# Patient Record
Sex: Female | Born: 1960 | Race: White | Hispanic: No | Marital: Married | Smoking: Never smoker
Health system: Southern US, Community
[De-identification: ages and names within clinical notes are randomized; demographics above are authoritative.]

## PROBLEM LIST (undated history)

## (undated) DIAGNOSIS — J45909 Unspecified asthma, uncomplicated: Secondary | ICD-10-CM

## (undated) DIAGNOSIS — B009 Herpesviral infection, unspecified: Secondary | ICD-10-CM

## (undated) DIAGNOSIS — I951 Orthostatic hypotension: Secondary | ICD-10-CM

## (undated) DIAGNOSIS — K635 Polyp of colon: Secondary | ICD-10-CM

## (undated) DIAGNOSIS — F39 Unspecified mood [affective] disorder: Secondary | ICD-10-CM

## (undated) DIAGNOSIS — M8580 Other specified disorders of bone density and structure, unspecified site: Secondary | ICD-10-CM

## (undated) DIAGNOSIS — H35033 Hypertensive retinopathy, bilateral: Secondary | ICD-10-CM

## (undated) DIAGNOSIS — G43909 Migraine, unspecified, not intractable, without status migrainosus: Secondary | ICD-10-CM

## (undated) DIAGNOSIS — J301 Allergic rhinitis due to pollen: Secondary | ICD-10-CM

## (undated) DIAGNOSIS — B001 Herpesviral vesicular dermatitis: Secondary | ICD-10-CM

## (undated) HISTORY — DX: Polyp of colon: K63.5

## (undated) HISTORY — DX: Herpesviral infection, unspecified: B00.9

## (undated) HISTORY — DX: Hypertensive retinopathy, bilateral: H35.033

## (undated) HISTORY — DX: Orthostatic hypotension: I95.1

## (undated) HISTORY — DX: Migraine, unspecified, not intractable, without status migrainosus: G43.909

## (undated) HISTORY — DX: Herpesviral vesicular dermatitis: B00.1

## (undated) HISTORY — DX: Unspecified mood (affective) disorder: F39

## (undated) HISTORY — DX: Other specified disorders of bone density and structure, unspecified site: M85.80

## (undated) HISTORY — DX: Allergic rhinitis due to pollen: J30.1

---

## 1998-08-17 ENCOUNTER — Other Ambulatory Visit: Admission: RE | Admit: 1998-08-17 | Discharge: 1998-08-17 | Payer: Self-pay | Admitting: Gynecology

## 1999-02-04 ENCOUNTER — Other Ambulatory Visit: Admission: RE | Admit: 1999-02-04 | Discharge: 1999-02-04 | Payer: Self-pay | Admitting: Gynecology

## 1999-09-11 ENCOUNTER — Other Ambulatory Visit: Admission: RE | Admit: 1999-09-11 | Discharge: 1999-09-11 | Payer: Self-pay | Admitting: Gynecology

## 1999-10-07 ENCOUNTER — Encounter: Payer: Self-pay | Admitting: Gynecology

## 1999-10-07 ENCOUNTER — Encounter: Admission: RE | Admit: 1999-10-07 | Discharge: 1999-10-07 | Payer: Self-pay | Admitting: Gynecology

## 1999-10-09 ENCOUNTER — Encounter: Payer: Self-pay | Admitting: Gynecology

## 1999-10-09 ENCOUNTER — Encounter: Admission: RE | Admit: 1999-10-09 | Discharge: 1999-10-09 | Payer: Self-pay | Admitting: Gynecology

## 2000-09-10 ENCOUNTER — Other Ambulatory Visit: Admission: RE | Admit: 2000-09-10 | Discharge: 2000-09-10 | Payer: Self-pay | Admitting: Gynecology

## 2000-11-03 ENCOUNTER — Encounter: Admission: RE | Admit: 2000-11-03 | Discharge: 2000-11-03 | Payer: Self-pay | Admitting: Gynecology

## 2000-11-03 ENCOUNTER — Encounter: Payer: Self-pay | Admitting: Gynecology

## 2001-09-22 ENCOUNTER — Encounter: Admission: RE | Admit: 2001-09-22 | Discharge: 2001-09-22 | Payer: Self-pay | Admitting: Gynecology

## 2001-09-22 ENCOUNTER — Other Ambulatory Visit: Admission: RE | Admit: 2001-09-22 | Discharge: 2001-09-22 | Payer: Self-pay | Admitting: Gynecology

## 2001-09-22 ENCOUNTER — Encounter: Payer: Self-pay | Admitting: Gynecology

## 2002-10-06 ENCOUNTER — Other Ambulatory Visit: Admission: RE | Admit: 2002-10-06 | Discharge: 2002-10-06 | Payer: Self-pay | Admitting: Gynecology

## 2002-10-07 ENCOUNTER — Encounter: Admission: RE | Admit: 2002-10-07 | Discharge: 2002-10-07 | Payer: Self-pay | Admitting: Gynecology

## 2002-10-07 ENCOUNTER — Encounter: Payer: Self-pay | Admitting: Gynecology

## 2003-10-09 ENCOUNTER — Other Ambulatory Visit: Admission: RE | Admit: 2003-10-09 | Discharge: 2003-10-09 | Payer: Self-pay | Admitting: Gynecology

## 2003-12-08 ENCOUNTER — Encounter: Admission: RE | Admit: 2003-12-08 | Discharge: 2003-12-08 | Payer: Self-pay | Admitting: Gynecology

## 2004-09-26 ENCOUNTER — Other Ambulatory Visit: Admission: RE | Admit: 2004-09-26 | Discharge: 2004-09-26 | Payer: Self-pay | Admitting: Gynecology

## 2004-12-16 ENCOUNTER — Encounter: Admission: RE | Admit: 2004-12-16 | Discharge: 2004-12-16 | Payer: Self-pay | Admitting: Gynecology

## 2005-05-28 ENCOUNTER — Ambulatory Visit (HOSPITAL_COMMUNITY): Admission: RE | Admit: 2005-05-28 | Discharge: 2005-05-28 | Payer: Self-pay | Admitting: Allergy and Immunology

## 2005-08-07 ENCOUNTER — Encounter: Admission: RE | Admit: 2005-08-07 | Discharge: 2005-08-07 | Payer: Self-pay | Admitting: Allergy and Immunology

## 2005-09-30 ENCOUNTER — Other Ambulatory Visit: Admission: RE | Admit: 2005-09-30 | Discharge: 2005-09-30 | Payer: Self-pay | Admitting: Gynecology

## 2005-12-17 ENCOUNTER — Encounter: Admission: RE | Admit: 2005-12-17 | Discharge: 2005-12-17 | Payer: Self-pay | Admitting: Gynecology

## 2006-10-05 ENCOUNTER — Other Ambulatory Visit: Admission: RE | Admit: 2006-10-05 | Discharge: 2006-10-05 | Payer: Self-pay | Admitting: Gynecology

## 2006-12-21 ENCOUNTER — Encounter: Admission: RE | Admit: 2006-12-21 | Discharge: 2006-12-21 | Payer: Self-pay | Admitting: Gynecology

## 2006-12-31 ENCOUNTER — Encounter: Admission: RE | Admit: 2006-12-31 | Discharge: 2006-12-31 | Payer: Self-pay | Admitting: Gynecology

## 2007-12-24 ENCOUNTER — Encounter: Admission: RE | Admit: 2007-12-24 | Discharge: 2007-12-24 | Payer: Self-pay | Admitting: Gynecology

## 2008-12-29 ENCOUNTER — Encounter: Admission: RE | Admit: 2008-12-29 | Discharge: 2008-12-29 | Payer: Self-pay | Admitting: Gynecology

## 2010-01-01 ENCOUNTER — Encounter: Admission: RE | Admit: 2010-01-01 | Discharge: 2010-01-01 | Payer: Self-pay | Admitting: Gynecology

## 2010-12-26 ENCOUNTER — Other Ambulatory Visit: Payer: Self-pay | Admitting: Gynecology

## 2010-12-26 DIAGNOSIS — Z1239 Encounter for other screening for malignant neoplasm of breast: Secondary | ICD-10-CM

## 2011-01-06 ENCOUNTER — Ambulatory Visit
Admission: RE | Admit: 2011-01-06 | Discharge: 2011-01-06 | Disposition: A | Discharge status: Home / Self Care | Payer: PRIVATE HEALTH INSURANCE | Source: Ambulatory Visit | Attending: Gynecology | Admitting: Gynecology

## 2011-01-06 DIAGNOSIS — Z1239 Encounter for other screening for malignant neoplasm of breast: Secondary | ICD-10-CM

## 2011-01-08 ENCOUNTER — Other Ambulatory Visit: Payer: Self-pay | Admitting: Gynecology

## 2011-01-08 DIAGNOSIS — R928 Other abnormal and inconclusive findings on diagnostic imaging of breast: Secondary | ICD-10-CM

## 2011-01-14 ENCOUNTER — Ambulatory Visit
Admission: RE | Admit: 2011-01-14 | Discharge: 2011-01-14 | Disposition: A | Discharge status: Home / Self Care | Payer: PRIVATE HEALTH INSURANCE | Source: Ambulatory Visit | Attending: Gynecology | Admitting: Gynecology

## 2011-01-14 DIAGNOSIS — R928 Other abnormal and inconclusive findings on diagnostic imaging of breast: Secondary | ICD-10-CM

## 2011-12-29 ENCOUNTER — Other Ambulatory Visit: Payer: Self-pay | Admitting: Gynecology

## 2011-12-29 DIAGNOSIS — Z1231 Encounter for screening mammogram for malignant neoplasm of breast: Secondary | ICD-10-CM

## 2012-01-19 ENCOUNTER — Ambulatory Visit
Admission: RE | Admit: 2012-01-19 | Discharge: 2012-01-19 | Disposition: A | Discharge status: Home / Self Care | Payer: PRIVATE HEALTH INSURANCE | Source: Ambulatory Visit | Attending: Gynecology | Admitting: Gynecology

## 2012-01-19 DIAGNOSIS — Z1231 Encounter for screening mammogram for malignant neoplasm of breast: Secondary | ICD-10-CM

## 2013-01-25 ENCOUNTER — Other Ambulatory Visit: Payer: Self-pay | Admitting: Gynecology

## 2013-01-26 ENCOUNTER — Other Ambulatory Visit: Payer: Self-pay | Admitting: Obstetrics and Gynecology

## 2013-01-26 DIAGNOSIS — M949 Disorder of cartilage, unspecified: Secondary | ICD-10-CM

## 2013-01-26 DIAGNOSIS — M899 Disorder of bone, unspecified: Secondary | ICD-10-CM

## 2013-01-26 DIAGNOSIS — Z1231 Encounter for screening mammogram for malignant neoplasm of breast: Secondary | ICD-10-CM

## 2013-03-03 ENCOUNTER — Ambulatory Visit
Admission: RE | Admit: 2013-03-03 | Discharge: 2013-03-03 | Disposition: A | Discharge status: Home / Self Care | Payer: BC Managed Care – PPO | Source: Ambulatory Visit | Attending: Obstetrics and Gynecology | Admitting: Obstetrics and Gynecology

## 2013-03-03 DIAGNOSIS — M899 Disorder of bone, unspecified: Secondary | ICD-10-CM

## 2013-03-03 DIAGNOSIS — Z1231 Encounter for screening mammogram for malignant neoplasm of breast: Secondary | ICD-10-CM

## 2013-03-07 ENCOUNTER — Other Ambulatory Visit: Payer: Self-pay | Admitting: Obstetrics and Gynecology

## 2013-03-07 DIAGNOSIS — R928 Other abnormal and inconclusive findings on diagnostic imaging of breast: Secondary | ICD-10-CM

## 2013-03-16 ENCOUNTER — Ambulatory Visit
Admission: RE | Admit: 2013-03-16 | Discharge: 2013-03-16 | Disposition: A | Discharge status: Home / Self Care | Payer: BC Managed Care – PPO | Source: Ambulatory Visit | Attending: Obstetrics and Gynecology | Admitting: Obstetrics and Gynecology

## 2013-03-16 DIAGNOSIS — R928 Other abnormal and inconclusive findings on diagnostic imaging of breast: Secondary | ICD-10-CM

## 2013-08-18 ENCOUNTER — Encounter (HOSPITAL_COMMUNITY): Payer: Self-pay | Admitting: Emergency Medicine

## 2013-08-18 ENCOUNTER — Inpatient Hospital Stay (HOSPITAL_COMMUNITY)
Admission: EM | Admit: 2013-08-18 | Discharge: 2013-08-20 | DRG: 297 | Disposition: A | Discharge status: Home / Self Care | Payer: BC Managed Care – PPO | Attending: Internal Medicine | Admitting: Internal Medicine

## 2013-08-18 DIAGNOSIS — H5509 Other forms of nystagmus: Secondary | ICD-10-CM | POA: Diagnosis present

## 2013-08-18 DIAGNOSIS — H905 Unspecified sensorineural hearing loss: Secondary | ICD-10-CM | POA: Diagnosis present

## 2013-08-18 DIAGNOSIS — H8109 Meniere's disease, unspecified ear: Secondary | ICD-10-CM | POA: Diagnosis present

## 2013-08-18 DIAGNOSIS — I10 Essential (primary) hypertension: Secondary | ICD-10-CM | POA: Diagnosis present

## 2013-08-18 DIAGNOSIS — R42 Dizziness and giddiness: Secondary | ICD-10-CM | POA: Diagnosis present

## 2013-08-18 DIAGNOSIS — E871 Hypo-osmolality and hyponatremia: Principal | ICD-10-CM | POA: Diagnosis present

## 2013-08-18 DIAGNOSIS — Z79899 Other long term (current) drug therapy: Secondary | ICD-10-CM

## 2013-08-18 DIAGNOSIS — T502X5A Adverse effect of carbonic-anhydrase inhibitors, benzothiadiazides and other diuretics, initial encounter: Secondary | ICD-10-CM | POA: Diagnosis present

## 2013-08-18 DIAGNOSIS — E876 Hypokalemia: Secondary | ICD-10-CM | POA: Diagnosis present

## 2013-08-18 DIAGNOSIS — J45909 Unspecified asthma, uncomplicated: Secondary | ICD-10-CM | POA: Diagnosis present

## 2013-08-18 HISTORY — DX: Unspecified asthma, uncomplicated: J45.909

## 2013-08-18 LAB — CBC WITH DIFFERENTIAL/PLATELET
Basophils Absolute: 0 10*3/uL (ref 0.0–0.1)
Basophils Relative: 0 % (ref 0–1)
Eosinophils Relative: 0 % (ref 0–5)
Lymphs Abs: 0.9 10*3/uL (ref 0.7–4.0)

## 2013-08-18 LAB — BASIC METABOLIC PANEL
BUN: 14 mg/dL (ref 6–23)
CO2: 23 mEq/L (ref 19–32)
GFR calc non Af Amer: >90 mL/min (ref >90)
Sodium: 115 mEq/L — CL (ref 135–145)

## 2013-08-18 MED ORDER — DIAZEPAM 5 MG/ML IJ SOLN: 5.0000 mg | Freq: Three times a day (TID) | INTRAMUSCULAR | Status: DC | PRN

## 2013-08-18 MED ORDER — HYDRALAZINE HCL 20 MG/ML IJ SOLN: 10.0000 mg | Freq: Four times a day (QID) | INTRAMUSCULAR | Status: DC | PRN

## 2013-08-18 MED ORDER — ADULT MULTIVITAMIN W/MINERALS CH
1.0000 | ORAL_TABLET | Freq: Every day | ORAL | Status: DC
  Administered 2013-08-19 – 2013-08-20 (×2): 1 via ORAL
  Filled 2013-08-18 (×2): qty 1

## 2013-08-18 MED ORDER — POTASSIUM CHLORIDE CRYS ER 20 MEQ PO TBCR
40.0000 meq | EXTENDED_RELEASE_TABLET | Freq: Once | ORAL | Status: AC
  Administered 2013-08-18: 40 meq via ORAL
  Filled 2013-08-18: qty 2

## 2013-08-18 MED ORDER — VITAMIN D3 25 MCG (1000 UNIT) PO TABS
1000.0000 [IU] | ORAL_TABLET | Freq: Every day | ORAL | Status: DC
  Administered 2013-08-18 – 2013-08-20 (×3): 1000 [IU] via ORAL
  Filled 2013-08-18 (×3): qty 1

## 2013-08-18 MED ORDER — ONDANSETRON HCL 4 MG/2ML IJ SOLN
4.0000 mg | Freq: Once | INTRAMUSCULAR | Status: AC
  Administered 2013-08-18: 4 mg via INTRAVENOUS
  Filled 2013-08-18: qty 2

## 2013-08-18 MED ORDER — DIAZEPAM 5 MG/ML IJ SOLN
5.0000 mg | Freq: Once | INTRAMUSCULAR | Status: AC
  Administered 2013-08-18: 5 mg via INTRAVENOUS
  Filled 2013-08-18: qty 2

## 2013-08-18 MED ORDER — PROMETHAZINE HCL 25 MG/ML IJ SOLN
12.5000 mg | INTRAMUSCULAR | Status: DC | PRN
  Filled 2013-08-18: qty 1

## 2013-08-18 MED ORDER — LORAZEPAM 2 MG/ML IJ SOLN
1.0000 mg | Freq: Once | INTRAMUSCULAR | Status: AC
  Administered 2013-08-18: 1 mg via INTRAVENOUS
  Filled 2013-08-18: qty 1

## 2013-08-18 MED ORDER — SODIUM CHLORIDE 0.9 % IV SOLN
INTRAVENOUS | Status: DC
  Administered 2013-08-18: 18:00:00 via INTRAVENOUS

## 2013-08-18 MED ORDER — MECLIZINE HCL 25 MG PO TABS
25.0000 mg | ORAL_TABLET | Freq: Three times a day (TID) | ORAL | Status: DC | PRN
  Administered 2013-08-18 – 2013-08-20 (×2): 25 mg via ORAL
  Filled 2013-08-18 (×3): qty 1

## 2013-08-18 MED ORDER — ACETAMINOPHEN 325 MG PO TABS
650.0000 mg | ORAL_TABLET | Freq: Four times a day (QID) | ORAL | Status: DC | PRN
  Administered 2013-08-19: 650 mg via ORAL
  Filled 2013-08-18: qty 2

## 2013-08-18 MED ORDER — POTASSIUM CHLORIDE 10 MEQ/100ML IV SOLN
10.0000 meq | INTRAVENOUS | Status: AC
  Administered 2013-08-18 – 2013-08-19 (×5): 10 meq via INTRAVENOUS
  Filled 2013-08-18 (×6): qty 100

## 2013-08-18 MED ORDER — FLUTICASONE PROPIONATE HFA 44 MCG/ACT IN AERO
1.0000 | INHALATION_SPRAY | Freq: Two times a day (BID) | RESPIRATORY_TRACT | Status: DC
  Administered 2013-08-19 – 2013-08-20 (×2): 1 via RESPIRATORY_TRACT
  Filled 2013-08-18 (×2): qty 10.6

## 2013-08-18 MED ORDER — ACETAMINOPHEN 650 MG RE SUPP: 650.0000 mg | Freq: Four times a day (QID) | RECTAL | Status: DC | PRN

## 2013-08-18 MED ORDER — SODIUM CHLORIDE 0.9 % IV BOLUS (SEPSIS)
1000.0000 mL | Freq: Once | INTRAVENOUS | Status: AC
  Administered 2013-08-18: 1000 mL via INTRAVENOUS

## 2013-08-18 MED ORDER — SODIUM CHLORIDE 0.9 % IJ SOLN
3.0000 mL | Freq: Two times a day (BID) | INTRAMUSCULAR | Status: DC
  Administered 2013-08-20: 3 mL via INTRAVENOUS

## 2013-08-18 NOTE — ED Provider Notes (Signed)
Date: 08/18/2013  Rate: 76  Rhythm: normal sinus rhythm  QRS Axis: normal  Intervals: Prolonged QTC at 544 ms  ST/T Wave abnormalities: normal  Conduction Disutrbances: none  Narrative Interpretation: Prolonged QTC, mild T wave flattening in lateral leads, no ischemic changes, no old for comparison      Layla Maw Zay Yeargan, DO 08/18/13 1829

## 2013-08-18 NOTE — ED Notes (Signed)
1O10-- report given to charge nurse on [facility]

## 2013-08-18 NOTE — ED Provider Notes (Signed)
TIME SEEN: 3:57 PM  CHIEF COMPLAINT: Vertigo, vomiting  HPI: Patient is a 52 year old female with a history of asthma who presents emergency department with intermittent vertigo for the past 3 weeks. She was seen by Dr. Haroldine Laws with ENT was diagnosed with Mnire's disease when she was found to have mild sensorineural hearing loss. She was started on diuretic and potassium and states that her symptoms have gotten progressively worse instead of better. Today she was seen by her primary care physician, Dr. Valentina Lucks, at [facility] when her symptoms became much worse and she began vomiting. She states that her vertigo is worse with movement. She denies any obvious hearing loss or tinnitus. No ear pain or discharge. No recent fever, cough or other upper respiratory illness. She denies any numbness, tingling or focal weakness. No prior history of stroke. No recent head injury. She is not on anticoagulation.  ROS: See HPI Constitutional: no fever  Eyes: no drainage  ENT: no runny nose   Cardiovascular:  no chest pain  Resp: no SOB  GI: no vomiting GU: no dysuria Integumentary: no rash  Allergy: no hives  Musculoskeletal: no leg swelling  Neurological: no slurred speech ROS otherwise negative  PAST MEDICAL HISTORY/PAST SURGICAL HISTORY:  No past medical history on file.  MEDICATIONS:  Prior to Admission medications   Not on File    ALLERGIES:  No Known Allergies  SOCIAL HISTORY:  History  Substance Use Topics  . Smoking status: Not on file  . Smokeless tobacco: Not on file  . Alcohol Use: Not on file    FAMILY HISTORY: No family history on file.  EXAM: There were no vitals taken for this visit. CONSTITUTIONAL: Alert and oriented and responds appropriately to questions. Well-appearing; well-nourished HEAD: Normocephalic EYES: Conjunctivae clear, PERRL ENT: normal nose; no rhinorrhea; moist mucous membranes; pharynx without lesions noted, TMs are clear bilaterally NECK:  Supple, no meningismus, no LAD  CARD: RRR; S1 and S2 appreciated; no murmurs, no clicks, no rubs, no gallops RESP: Normal chest excursion without splinting or tachypnea; breath sounds clear and equal bilaterally; no wheezes, no rhonchi, no rales,  ABD/GI: Normal bowel sounds; non-distended; soft, non-tender, no rebound, no guarding BACK:  The back appears normal and is non-tender to palpation, there is no CVA tenderness EXT: Normal ROM in all joints; non-tender to palpation; no edema; normal capillary refill; no cyanosis    SKIN: Normal color for age and race; warm NEURO: Moves all extremities equally, sensation to light touch intact diffusely, cranial nerves II through XII intact PSYCH: The patient's mood and manner are appropriate. Grooming and personal hygiene are appropriate.  MEDICAL DECISION MAKING: Patient with peripheral vertigo who was diagnosed with Mnire's disease by ENT.  I am not concerned for central cause of vertigo at this time. Patient has no recent history of head injury and is on anticoagulation. She also has no risk factors for stroke.  Her CHADS2 score is 0.  Will check basic labs, give medication for symptom control and reassess.  ED PROGRESS: Patient has a sodium of 115 potassium of 2.3. Her other labs appear normal. I do not suspect this is hemodilution rather due to her diuretic use. She is receiving IV fluids currently. We'll also replace her potassium with IV medication. Her vertigo has improved after Valium. Her PCP is at [facility], Dr. Valentina Lucks. Will discuss with hospitalist for admission giving hyponatremia and hypokalemia.  Patient is neurologically intact. No seizures. No current complaints. She states she feels much  better.  CRITICAL CARE Performed by: Raelyn Number   Total critical care time: 30 minutes  Critical care time was exclusive of separately billable procedures and treating other patients.  Critical care was necessary to treat or prevent  imminent or life-threatening deterioration.  Critical care was time spent personally by me on the following activities: development of treatment plan with patient and/or surrogate as well as nursing, discussions with consultants, evaluation of patient's response to treatment, examination of patient, obtaining history from patient or surrogate, ordering and performing treatments and interventions, ordering and review of laboratory studies, ordering and review of radiographic studies, pulse oximetry and re-evaluation of patient's condition.   Layla Maw Ward, DO 08/18/13 [phone]

## 2013-08-18 NOTE — H&P (Addendum)
[facility] History and Physical  Renee Hansen AOZ:[id number] DOB: 10-19-1961 DOA: 08/18/2013  Referring physician: er PCP: Astrid Divine, MD  Specialists:   Chief Complaint: sent by PCP  HPI: Renee Hansen is a 52 y.o. female  Who is relatively healthy.  Comes in after being started on chlorithalidone for vertigo by ENT- diagnosed with Meniere's dsease.  Vertigo has actually been worsening.  She saw her PCP today and was found to have a low Na and low K.  Her vertigo was also worsened causing her to vomit.  PCP sent to ER.  No fever, no chills, no SOB, no CP. No h/x of CVA- no h/o arrythmia.  Na was 115 in ER, and K was 2.3- Mg was requested.  IV valium given with improvement in symptoms.    Review of Systems: all systems reviewed, negative unless stated above   Past Medical History  Diagnosis Date  . Asthma    History reviewed. No pertinent past surgical history. Social History:  reports that she has never smoked. She does not have any smokeless tobacco history on file. She reports that she does not drink alcohol or use illicit drugs.  No Known Allergies  History reviewed. No pertinent family history.   Prior to Admission medications   Medication Sig Start Date End Date Taking? Authorizing Provider  beclomethasone (QVAR) 40 MCG/ACT inhaler Inhale 1 puff into the lungs daily.   Yes Historical Provider, MD  chlorthalidone (HYGROTON) 25 MG tablet Take 25 mg by mouth every other day.   Yes Historical Provider, MD  cholecalciferol (VITAMIN D) 1000 UNITS tablet Take 1,000 Units by mouth daily.   Yes Historical Provider, MD  fish oil-omega-3 fatty acids 1000 MG capsule Take 1 g by mouth daily.   Yes Historical Provider, MD  meclizine (ANTIVERT) 25 MG tablet Take 25 mg by mouth 3 (three) times daily as needed.   Yes Historical Provider, MD  Multiple Vitamin (MULTIVITAMIN WITH MINERALS) TABS tablet Take 1 tablet by mouth daily.   Yes Historical Provider, MD   potassium chloride (K-DUR) 10 MEQ tablet Take 10 mEq by mouth every other day.   Yes Historical Provider, MD  vitamin C (ASCORBIC ACID) 500 MG tablet Take 500 mg by mouth daily.   Yes Historical Provider, MD  vitamin E 400 UNIT capsule Take 400 Units by mouth daily.   Yes Historical Provider, MD   Physical Exam: Filed Vitals:   08/18/13 1621  BP: 150/123  Pulse: 83  Temp: 97.8 F (36.6 C)  Resp: 16     General:  A+Ox3, NAD  Eyes: *wnl  ENT: wnl  Neck: supple  Cardiovascular: rrr  Respiratory: clear anterior  Abdomen: +BS, soft  Skin: no rashes or lesions  Musculoskeletal: moves all 4 ext- strength equal  Psychiatric: normal mood/affect  Neurologic: CN 2-12 intact  Labs on Admission:  Basic Metabolic Panel:  Recent Labs Lab 08/18/13 1714  NA 115*  K 2.3*  CL 72*  CO2 23  GLUCOSE 154*  BUN 14  CREATININE 0.65  CALCIUM 9.2   Liver Function Tests: No results found for this basename: AST, ALT, ALKPHOS, BILITOT, PROT, ALBUMIN,  in the last 168 hours No results found for this basename: LIPASE, AMYLASE,  in the last 168 hours No results found for this basename: AMMONIA,  in the last 168 hours CBC:  Recent Labs Lab 08/18/13 1714  WBC 9.0  NEUTROABS 7.1  HGB 13.4  HCT 35.2*  MCV 81.7  PLT 211  Cardiac Enzymes: No results found for this basename: CKTOTAL, CKMB, CKMBINDEX, TROPONINI,  in the last 168 hours  BNP (last 3 results) No results found for this basename: PROBNP,  in the last 8760 hours CBG: No results found for this basename: GLUCAP,  in the last 168 hours  Radiological Exams on Admission: No results found.    Assessment/Plan Active Problems:   Hyponatremia   Hypokalemia   Vertigo   1. Hyponatremia- gentle IVF, recheck Na later tonight and then again in AM- prob due to diuretic 2. Hypokalemia- replete, check Mg 3. Vertigo- relieved with valium IV in ER- has seen ENT as outpatient 4. Prolonged Qtc on EKG- recheck in AM- hold  prolonging agents, monitor on tele 5. HTn- PRN and monitor    Code Status: full Family Communication: patient Disposition Plan: admit  Time spent: 70 min  Neelam Tiggs [facility] Pager [phone]  If 7PM-7AM, please contact night-coverage [email or web address] Password [email or web address] 08/18/2013, 6:18 PM

## 2013-08-18 NOTE — ED Notes (Signed)
Has been being treated for Menier's ds by Dr. Esther Hardy for 2 weeks, has gotten worse, went to see primary care dr, and had an episode of severe dizziness/nausea. Transported by EMS from dr's office, any movement increased nausea and dizziness.

## 2013-08-19 ENCOUNTER — Encounter (HOSPITAL_COMMUNITY): Payer: Self-pay

## 2013-08-19 ENCOUNTER — Inpatient Hospital Stay (HOSPITAL_COMMUNITY): Payer: BC Managed Care – PPO

## 2013-08-19 LAB — BASIC METABOLIC PANEL
BUN: 13 mg/dL (ref 6–23)
BUN: 11 mg/dL (ref 6–23)
BUN: 14 mg/dL (ref 6–23)
CO2: 28 mEq/L (ref 19–32)
CO2: 29 mEq/L (ref 19–32)
CO2: 30 mEq/L (ref 19–32)
CO2: 32 mEq/L (ref 19–32)
Calcium: 8.8 mg/dL (ref 8.4–10.5)
Chloride: 85 mEq/L — ABNORMAL LOW (ref 96–112)
Chloride: 91 mEq/L — ABNORMAL LOW (ref 96–112)
Creatinine, Ser: 0.71 mg/dL (ref 0.50–1.10)
GFR calc Af Amer: >90 mL/min (ref >90)
GFR calc non Af Amer: >90 mL/min (ref >90)
GFR calc non Af Amer: >90 mL/min (ref >90)
Glucose, Bld: 122 mg/dL — ABNORMAL HIGH (ref 70–99)
Glucose, Bld: 92 mg/dL (ref 70–99)
Glucose, Bld: 107 mg/dL — ABNORMAL HIGH (ref 70–99)
Potassium: 3.3 mEq/L — ABNORMAL LOW (ref 3.5–5.1)
Potassium: 2.7 mEq/L — CL (ref 3.5–5.1)
Potassium: 3 mEq/L — ABNORMAL LOW (ref 3.5–5.1)
Sodium: 121 mEq/L — ABNORMAL LOW (ref 135–145)
Sodium: 131 mEq/L — ABNORMAL LOW (ref 135–145)

## 2013-08-19 LAB — URINALYSIS, ROUTINE W REFLEX MICROSCOPIC
Bilirubin Urine: NEGATIVE
Ketones, ur: 40 mg/dL — AB
Nitrite: NEGATIVE
Protein, ur: NEGATIVE mg/dL
Urobilinogen, UA: 1 mg/dL (ref 0.0–1.0)

## 2013-08-19 LAB — CBC
Hemoglobin: 13 g/dL (ref 12.0–15.0)
MCH: 30.7 pg (ref 26.0–34.0)
MCHC: 36.9 g/dL — ABNORMAL HIGH (ref 30.0–36.0)
MCV: 83.2 fL (ref 78.0–100.0)
RBC: 4.23 MIL/uL (ref 3.87–5.11)

## 2013-08-19 MED ORDER — IBUPROFEN 600 MG PO TABS
600.0000 mg | ORAL_TABLET | Freq: Four times a day (QID) | ORAL | Status: DC | PRN
  Filled 2013-08-19: qty 1

## 2013-08-19 MED ORDER — POTASSIUM CHLORIDE CRYS ER 20 MEQ PO TBCR
60.0000 meq | EXTENDED_RELEASE_TABLET | Freq: Four times a day (QID) | ORAL | Status: AC
  Administered 2013-08-19 (×2): 60 meq via ORAL
  Filled 2013-08-19 (×2): qty 3

## 2013-08-19 MED ORDER — POTASSIUM CHLORIDE 2 MEQ/ML IV SOLN
INTRAVENOUS | Status: DC
  Administered 2013-08-19 – 2013-08-20 (×2): via INTRAVENOUS
  Filled 2013-08-19 (×3): qty 1000

## 2013-08-19 MED ORDER — GADOBENATE DIMEGLUMINE 529 MG/ML IV SOLN
15.0000 mL | Freq: Once | INTRAVENOUS | Status: AC
  Administered 2013-08-19: 12 mL via INTRAVENOUS

## 2013-08-19 MED ORDER — MAGNESIUM SULFATE 40 MG/ML IJ SOLN
2.0000 g | Freq: Once | INTRAMUSCULAR | Status: AC
  Administered 2013-08-19: 2 g via INTRAVENOUS
  Filled 2013-08-19: qty 50

## 2013-08-19 NOTE — Care Management Note (Unsigned)
    Page 1 of 1   08/19/2013     3:00:59 PM   CARE MANAGEMENT NOTE 08/19/2013  Patient:  Renee Hansen, Renee Hansen   Account Number:  [id number]  Date Initiated:  08/19/2013  Documentation initiated by:  Letha Cape  Subjective/Objective Assessment:   dx hyponatremia  admit- lives with spouse, pta indep.     Action/Plan:   pt eval   Anticipated DC Date:  08/20/2013   Anticipated DC Plan:  HOME W HOME HEALTH SERVICES      DC Planning Services  CM consult      Choice offered to / List presented to:             Status of service:  In process, will continue to follow Medicare Important Message given?   (If response is "NO", the following Medicare IM given date fields will be blank) Date Medicare IM given:   Date Additional Medicare IM given:    Discharge Disposition:    Per UR Regulation:  Reviewed for med. necessity/level of care/duration of stay  If discussed at Long Length of Stay Meetings, dates discussed:    Comments:  08/19/13 14:59 Letha Cape RN BSN [phone] patient lives with spouse , pta indep.  await pt eval.  NCM will continue to follow for dc needs.

## 2013-08-19 NOTE — Progress Notes (Signed)
[facility] PROGRESS NOTE  Renee Hansen ZOX:[id number] DOB: 1961-09-03 DOA: 08/18/2013 PCP: Astrid Divine, MD  52 yo with three week history of vertigo.  She was started on chlorithalidone for possible meniere's disease. Admitted with severe electrolyte abnormalities.  Assessment/Plan:  Hypokalemia, hypochloridemia, hyponatremia Likely secondary to diuretic use So severe patient had neurological changes and vomiting Slowly resolving with IV fluids and supplementation Labs obtained from PCP office: BMET and CBC were normal on 07/27/13  Vertigo ?meniere's Check MRI brain PT eval PRN meclizine PRN valium  Prolonged Qtc on EKG Likely related to electrolyte abnormalities Will monitor and be cautious with medications Repeat EKG on 9/20 am  HTN Not currently on medications. Will monitor.   DVT Prophylaxis:    Code Status: full Family Communication: husband at bedside Disposition Plan: inpatient.   Consultants:  PT  Procedures:    Antibiotics:    HPI/Subjective: Patient feeling much better today. Hungry.  Complains of headache, but no longer vomiting or shaking.  Objective: Filed Vitals:   08/18/13 1745 08/19/13 0613 08/19/13 0944 08/19/13 1028  BP: 117/91 108/62    Pulse: 77 75    Temp:  98.7 F (37.1 C)  98.5 F (36.9 C)  TempSrc:  Oral  Oral  Resp:  16    Height:   5\' 11"  (1.803 m)   Weight:   61.236 kg (135 lb)   SpO2: 100% 96%      Intake/Output Summary (Last 24 hours) at 08/19/13 1219 Last data filed at 08/19/13 0600  Gross per 24 hour  Intake   1880 ml  Output      0 ml  Net   1880 ml   Filed Weights   08/19/13 0944  Weight: 61.236 kg (135 lb)    Exam:   General:  A&O, NAD, Lying comfortably in bed  Cardiovascular: RRR, no murmurs, rubs or gallops, no lower extremity edema  Respiratory: CTA, no wheeze, crackles, or rales.  No increased work of breathing.  Abdomen: Soft, non-tender, non-distended, + bowel  sounds, no masses  Musculoskeletal: Able to move all 4 extremities, 5/5 strength in each  Neuro:  + horizontal nystagmus (mild).  Otherwise non focal.  Data Reviewed: Basic Metabolic Panel:  Recent Labs Lab 08/18/13 1714 08/18/13 2232 08/19/13 0615  NA 115* 119* 121*  K 2.3* 2.1* 3.3*  CL 72* 77* 81*  CO2 23 28 29   GLUCOSE 154* 122* 92  BUN 14 13 11   CREATININE 0.65 0.71 0.74  CALCIUM 9.2 8.8 9.4  MG 1.5 1.7  --    CBC:  Recent Labs Lab 08/18/13 1714 08/19/13 0615  WBC 9.0 10.6*  NEUTROABS 7.1  --   HGB 13.4 13.0  HCT 35.2* 35.2*  MCV 81.7 83.2  PLT 211 220    Studies: No results found.  Scheduled Meds: . cholecalciferol  1,000 Units Oral Daily  . fluticasone  1 puff Inhalation BID  . multivitamin with minerals  1 tablet Oral Daily  . sodium chloride  3 mL Intravenous Q12H   Continuous Infusions: . sodium chloride 0.9 % 1,000 mL with potassium chloride 40 mEq infusion      Active Problems:   Hyponatremia   Hypokalemia   Vertigo    Conley Canal  [facility] Pager [phone]. If 7PM-7AM, please contact night-coverage at [email or web address], password [email or web address] 08/19/2013, 12:19 PM  LOS: 1 day

## 2013-08-19 NOTE — Progress Notes (Signed)
Brief Nutrition Note:  Discussed pt in team rounds, asked to speak with pt.  Pt states she was eating well prior to admission, except for 2-3 days where her intake was limited to crackers and ginger ale. Pt appetite is improving, ate 50% of breakfast this morning.   Encouraged pt to ask for snacks as needed from unit.   Body mass index is 18.84 kg/(m^2). WNL Diet: Regular  Chart reviewed, no nutrition interventions warranted at this time. Please consult as needed.   Renee Hansen RD, LDN Pager [phone] After Hours pager [phone]

## 2013-08-19 NOTE — Evaluation (Signed)
Physical Therapy Evaluation Patient Details Name: Renee Hansen MRN: [id number] DOB: [birth year] Today's Date: 08/19/2013 Time: 1531-1610 PT Time Calculation (min): 39 min  PT Assessment / Plan / Recommendation History of Present Illness  Patient is a 52 yo female admitted with hyponatremia, hypokalemia, worsening vertigo, and hypotension.   Clinical Impression  Patient presents with problems listed below.  Performed vestibular evaluation.  Normal oculomotor evaluation.  Dix-Hallpike and Roll testing were negative for BPPV. Patient with dizziness and oscillopsia noted with gait - bil. Hypofunction.  Provided exercises and will continue in am.  Patient also with hypotension, low sodium and potassium - could contribute to dizziness as well.  May need OP PT for vestibular rehab program - will determine at next visit.    PT Assessment  Patient needs continued PT services    Follow Up Recommendations  Outpatient PT;Supervision for mobility/OOB    Does the patient have the potential to tolerate intense rehabilitation      Barriers to Discharge        Equipment Recommendations  None recommended by PT    Recommendations for Other Services     Frequency Min 5X/week    Precautions / Restrictions Precautions Precautions: Fall Restrictions Weight Bearing Restrictions: No   Pertinent Vitals/Pain       Mobility  Bed Mobility Bed Mobility: Rolling Right;Rolling Left;Left Sidelying to Sit;Sitting - Scoot to [place] of Bed;Sit to Supine Rolling Right: 7: Independent Rolling Left: 7: Independent Left Sidelying to Sit: 7: Independent Sitting - Scoot to Edge of Bed: 7: Independent Sit to Supine: 7: Independent Details for Bed Mobility Assistance: No assist needed.  No dizziness or nystagmus noted with position changes. Transfers Transfers: Sit to Stand;Stand to Sit Sit to Stand: 5: Supervision;From bed Stand to Sit: 5: Supervision;To bed Details for Transfer Assistance: Supervision  for safety/balance.  No dizziness with standing.  Balance good with static standing. Ambulation/Gait Ambulation/Gait Assistance: 4: Min guard Ambulation Distance (Feet): 300 Feet Assistive device: None Ambulation/Gait Assistance Details: Patient noted slight dizziness with gait.  Patient with oscillopsia with gait.  Balance decreased with gait. Gait Pattern: Step-through pattern Gait velocity: WFL    Exercises Other Exercises Other Exercises: x1 exercise in standing Other Exercises: Saccades in sitting   PT Diagnosis: Abnormality of gait (Dizziness)  PT Problem List: Decreased balance (Dizziness) PT Treatment Interventions: Gait training;Functional mobility training;Therapeutic exercise;Balance training (Vestibular Rehab)     PT Goals(Current goals can be found in the care plan section) Acute Rehab PT Goals Patient Stated Goal: To stop dizziness PT Goal Formulation: With patient Time For Goal Achievement: 08/26/13 Potential to Achieve Goals: Good  Visit Information  Last PT Received On: 08/19/13 Assistance Needed: +1 History of Present Illness: Patient is a 52 yo female admitted with hyponatremia, hypokalemia, worsening vertigo, and hypotension.        Prior Functioning  Home Living Family/patient expects to be discharged to:: Private residence Living Arrangements: Spouse/significant other Available Help at Discharge: Family;Available 24 hours/day Type of Home: House Home Access: Stairs to enter [employer] of Steps: 4 Entrance Stairs-Rails: Right;Left Home Layout: One level Home Equipment: None Prior Function Level of Independence: Independent Comments: Works in Education officer, museum: No difficulties    Cognition  Cognition Arousal/Alertness: Awake/alert Behavior During Therapy: WFL for tasks assessed/performed Overall Cognitive Status: Within Functional Limits for tasks assessed    Extremity/Trunk Assessment Upper Extremity  Assessment Upper Extremity Assessment: Overall WFL for tasks assessed Lower Extremity Assessment Lower Extremity Assessment: Overall WFL for tasks  assessed Cervical / Trunk Assessment Cervical / Trunk Assessment: Normal   Balance High Level Balance High Level Balance Activites: Turns;Sudden stops;Head turns High Level Balance Comments: Noted decreased balance with high level activities.  End of Session PT - End of Session Equipment Utilized During Treatment: Gait belt Activity Tolerance: Patient tolerated treatment well Patient left: in bed;with call bell/phone within reach;with family/visitor present Nurse Communication: Mobility status  GP     Vena Austria 08/19/2013, 5:56 PM Durenda Hurt. Renaldo Fiddler, [facility] Acute Rehab Services Pager [phone]

## 2013-08-19 NOTE — Progress Notes (Signed)
CRITICAL VALUE ALERT  Critical value received:  K+2.1, Na 119  Date of notification:  08/19/13  Time of notification:  0022  Critical value read back:yes  Nurse who received alert:  Kennis Carina RN  MD notified (1st page):  Benedetto Coons NP  Time of first page:  0022  Responding MD:  Benedetto Coons NP  Time MD responded:  [id number]  Per NP, continue to administer potassium IVPB as scheduled. Please call NP for basic metabolic panel results in AM. Pt is resting with no distress a this time. Will continue to monitor. Gilman Schmidt

## 2013-08-19 NOTE — Progress Notes (Signed)
CRITICAL VALUE ALERT  Critical value received: K+ 2.7  Date of notification:  08/19/13  Time of notification:  17:30  Critical value read back:yes  Nurse who received alert: Larina Bras, RN  MD notified (1st page): Elmahi  Time of first page: 17:35  MD notified (2nd page): N/A  Time of second page:  Responding MD:  Arthor Captain  Time MD responded: 17:35

## 2013-08-19 NOTE — Progress Notes (Signed)
Addendum  Patient seen and examined, chart and data base reviewed.  I agree with the above assessment and plan.  For full details please see Mrs. Algis Downs PA note.  Present with hyponatremia and hypokalemia, was placed recently on diuretic for Mnire disease.   Complaining about vertigo, she does have horizontal nystagmus. PT/OT to evaluate.   Clint Lipps, MD [facility] Pager: [phone] 08/19/2013, 2:26 PM

## 2013-08-19 NOTE — Progress Notes (Signed)
  Pt orientation to unit, room and routine. Information packet given to patient and spouse.  Admission INP armband ID verified with patient/family, and in place. SR up x 2, fall risk assessment complete with Patient and family verbalizing understanding of risks associated with falls. Pt verbalizes an understanding of how to use the call bell and to call for help before getting out of bed.     Will cont to monitor and assist as needed.  Gilman Schmidt, RN

## 2013-08-20 LAB — CBC
HCT: 35.2 % — ABNORMAL LOW (ref 36.0–46.0)
Hemoglobin: 12.8 g/dL (ref 12.0–15.0)
MCHC: 36.4 g/dL — ABNORMAL HIGH (ref 30.0–36.0)
MCV: 85.6 fL (ref 78.0–100.0)

## 2013-08-20 LAB — BASIC METABOLIC PANEL
BUN: 12 mg/dL (ref 6–23)
Chloride: 99 mEq/L (ref 96–112)
GFR calc non Af Amer: >90 mL/min (ref >90)
Glucose, Bld: 91 mg/dL (ref 70–99)
Potassium: 4 mEq/L (ref 3.5–5.1)

## 2013-08-20 MED ORDER — MECLIZINE HCL 25 MG PO TABS: 25.0000 mg | ORAL_TABLET | Freq: Three times a day (TID) | ORAL | Status: DC | PRN

## 2013-08-20 NOTE — Discharge Summary (Signed)
Physician Discharge Summary  Renee Hansen ZOX:[id number] DOB: [birth year] DOA: 08/18/2013  PCP: Renee Divine, MD  Admit date: 08/18/2013 Discharge date: 08/20/2013  Time spent: 40 minutes  Recommendations for Outpatient Follow-up:  1. Followup with primary care physician within one week  Discharge Diagnoses:  Active Problems:   Hyponatremia   Hypokalemia   Vertigo   Discharge Condition: Stable  Diet recommendation: Regular diet  Filed Weights   08/19/13 0944  Weight: 61.236 kg (135 lb)    History of present illness:  Renee Hansen is a 52 y.o. female  Who is relatively healthy. Comes in after being started on chlorithalidone for vertigo by ENT- diagnosed with Meniere's dsease. Vertigo has actually been worsening. She saw her PCP today and was found to have a low Na and low K. Her vertigo was also worsened causing her to vomit. PCP sent to ER. No fever, no chills, no SOB, no CP. No h/x of CVA- no h/o arrythmia.  Na was 115 in ER, and K was 2.3- Mg was requested. IV valium given with improvement in symptoms.   Hospital Course:   1. Hyponatremia: Severe hyponatremia, likely secondary to chlorthalidone, patient was recently diagnosed with minerals disease and started on chlorthalidone. Patient presents to the hospital with sodium of 115, she acknowledged symptoms of dizziness and generalized weakness. Patient started on normal saline at 75 mL per hour, sodium went up nicely and slowly to one hundred thirty-five over two days. The correction rate was never exceeded 0.5 mEq per hour. BMP was checked every 8 hours while she is on normal saline.   2. Hyponatremia: Severe hyponatremia, likely secondary to chlorthalidone, although patient was taking potassium supplements or the diuretics but she did develop a hyponatremia, think this is also contributing to her generalized weakness. Patient presented with sodium of 2.1, this is repleted parenterally and orally, on the day of  discharge potassium is 4.0.   3. Vertigo: Patient has issues with imbalance, oscillopsia and vertigo. Patient evaluated by MRI of the brain showed no evidence of intracranial abnormalities, PT/OT evaluated the patient recommended outpatient PT for vestibular rehabilitation. Please note that the Dix-Hallpike maneuver in the Roll testing were negative for BPPV.  Procedures:  None  Consultations:  PT/OT recommended outpatient vestibular rehabilitation  Discharge Exam: Filed Vitals:   08/20/13 0646  BP: 117/71  Pulse: 78  Temp: 98.2 F (36.8 C)  Resp: 18  General: Alert and awake, oriented x3, not in any acute distress. HEENT: anicteric sclera, pupils reactive to light and accommodation, EOMI CVS: S1-S2 clear, no murmur rubs or gallops Chest: clear to auscultation bilaterally, no wheezing, rales or rhonchi Abdomen: soft nontender, nondistended, normal bowel sounds, no organomegaly Extremities: no cyanosis, clubbing or edema noted bilaterally Neuro: Cranial nerves II-XII intact, no focal neurological deficits  Discharge Instructions  Discharge Orders   Future Orders Complete By Expires   Ambulatory referral to Physical Therapy  As directed    Comments:     Vestibular rehab   Questions:     Iontophoresis - 4 mg/ml of dexamethasone:     T.E.N.S. Unit Evaluation and Dispense as Indicated:     Increase activity slowly  As directed        Medication List    STOP taking these medications       chlorthalidone 25 MG tablet  Commonly known as:  HYGROTON     potassium chloride 10 MEQ tablet  Commonly known as:  K-DUR      TAKE  these medications       beclomethasone 40 MCG/ACT inhaler  Commonly known as:  QVAR  Inhale 1 puff into the lungs daily.     cholecalciferol 1000 UNITS tablet  Commonly known as:  VITAMIN D  Take 1,000 Units by mouth daily.     fish oil-omega-3 fatty acids 1000 MG capsule  Take 1 g by mouth daily.     meclizine 25 MG tablet  Commonly known  as:  ANTIVERT  Take 1 tablet (25 mg total) by mouth 3 (three) times daily as needed.     multivitamin with minerals Tabs tablet  Take 1 tablet by mouth daily.     vitamin C 500 MG tablet  Commonly known as:  ASCORBIC ACID  Take 500 mg by mouth daily.     vitamin E 400 UNIT capsule  Take 400 Units by mouth daily.       No Known Allergies     Follow-up Information   Follow up with Renee Divine, MD In 1 week.   Specialty:  Family Medicine   Contact information:   [address], [address] [place] [address] [phone]        The results of significant diagnostics from this hospitalization (including imaging, microbiology, ancillary and laboratory) are listed below for reference.    Significant Diagnostic Studies: Mr Renee Hansen Contrast  24-Aug-2013   CLINICAL DATA:  52 year old female with acute pronounced vertigo. Discovered to have hyponatremia. Vomiting.  EXAM: MRI HEAD WITHOUT AND WITH CONTRAST  TECHNIQUE: Multiplanar, multiecho pulse sequences of the brain and surrounding structures were obtained according to standard protocol without and with intravenous contrast  CONTRAST:  12 mL MultiHance.  COMPARISON:  None.  FINDINGS: Cerebral volume is normal. No restricted diffusion to suggest acute infarction. No midline shift, mass effect, evidence of mass lesion, ventriculomegaly, extra-axial collection or acute intracranial hemorrhage. Cervicomedullary junction and pituitary are within normal limits. Major intracranial vascular flow voids are preserved. Negative visualized cervical spine.  Normal for age gray and white matter signal in the brain; 1 or 2 small foci of nonspecific subcortical white matter T2 and FLAIR hyperintensity (series 6, image 18). Normal appearance of the pituitary infundibulum, pituitary gland, and hypothalamus. No abnormal enhancement identified.  Normal bone marrow signal. Grossly normal visualized internal auditory structures. Mastoids are  clear.  Paranasal sinuses are clear. Visualized orbit soft tissues are within normal limits. Negative scalp soft tissues.  IMPRESSION: Normal MRI appearance of the brain. No acute intracranial abnormality identified.   Electronically Signed   By: Augusto Gamble M.D.   On: 08-24-2013 13:03    Microbiology: No results found for this or any previous visit (from the past 240 hour(s)).   Labs: Basic Metabolic Panel:  Recent Labs Lab 08/18/13 1714 08/18/13 2232 Aug 24, 2013 0615 08/24/2013 1640 24-Aug-2013 2100 08/20/13 0435  NA 115* 119* 121* 124* 131* 135  K 2.3* 2.1* 3.3* 2.7* 3.0* 4.0  CL 72* 77* 81* 85* 91* 99  CO2 23 28 29 30  32 29  GLUCOSE 154* 122* 92 97 107* 91  BUN 14 13 11 14 14 12   CREATININE 0.65 0.71 0.74 0.74 0.77 0.72  CALCIUM 9.2 8.8 9.4 9.1 8.9 8.9  MG 1.5 1.7  --   --   --   --    Liver Function Tests: No results found for this basename: AST, ALT, ALKPHOS, BILITOT, PROT, ALBUMIN,  in the last 168 hours No results found for this basename: LIPASE, AMYLASE,  in the last 168 hours No results found for this basename: AMMONIA,  in the last 168 hours CBC:  Recent Labs Lab 08/18/13 1714 08/19/13 0615 08/20/13 0435  WBC 9.0 10.6* 7.1  NEUTROABS 7.1  --   --   HGB 13.4 13.0 12.8  HCT 35.2* 35.2* 35.2*  MCV 81.7 83.2 85.6  PLT 211 220 215   Cardiac Enzymes: No results found for this basename: CKTOTAL, CKMB, CKMBINDEX, TROPONINI,  in the last 168 hours BNP: BNP (last 3 results) No results found for this basename: PROBNP,  in the last 8760 hours CBG: No results found for this basename: GLUCAP,  in the last 168 hours     Signed:  Jezabel Lecker A  [facility] 08/20/2013, 12:07 PM

## 2013-08-20 NOTE — Progress Notes (Signed)
   CARE MANAGEMENT NOTE 08/20/2013  Patient:  Renee Hansen, Renee Hansen   Account Number:  [id number]  Date Initiated:  08/19/2013  Documentation initiated by:  Letha Cape  Subjective/Objective Assessment:   dx hyponatremia  admit- lives with spouse, pta indep.     Action/Plan:   pt eval   Anticipated DC Date:  08/20/2013   Anticipated DC Plan:  HOME W HOME HEALTH SERVICES      DC Planning Services  CM consult      Choice offered to / List presented to:             Status of service:  Completed, signed off Medicare Important Message given?   (If response is "NO", the following Medicare IM given date fields will be blank) Date Medicare IM given:   Date Additional Medicare IM given:    Discharge Disposition:  HOME/SELF CARE  Per UR Regulation:  Reviewed for med. necessity/level of care/duration of stay  If discussed at Long Length of Stay Meetings, dates discussed:    Comments:  08/20/13 13:00 CM called to set up outpt vestibular rehab with [facility].  [facility].  Map given to pt and no other CM needs were communicated.  Freddy Jaksch, BSN, [place] [phone].   08/19/13 14:59 Letha Cape RN BSN [phone] patient lives with spouse , pta indep.  await pt eval.  NCM will continue to follow for dc needs.

## 2013-08-20 NOTE — Progress Notes (Signed)
Physical Therapy Treatment Patient Details Name: Renee Hansen MRN: [id number] DOB: 1961/03/12 Today's Date: 08/20/2013 Time: 4098-1191 PT Time Calculation (min): 24 min  PT Assessment / Plan / Recommendation  History of Present Illness Patient is a 52 yo female admitted with hyponatremia, hypokalemia, worsening vertigo, and hypotension.    PT Comments   Patient continued to have difficulty with smooth pursuits, eyes jumping from target.  Continues to report oscillopsia with gait.  Continues to report having "fullness" of head.  Patient provided with 3 exercises to complete at home - she and husband able to complete independently.  Patient to be discharged today.  Recommend OP PT for Vestibular Rehab program.  Follow Up Recommendations  Outpatient PT;Supervision for mobility/OOB     Does the patient have the potential to tolerate intense rehabilitation     Barriers to Discharge        Equipment Recommendations  None recommended by PT    Recommendations for Other Services    Frequency Min 5X/week   Progress towards PT Goals Progress towards PT goals: Goals met/education completed, patient discharged from PT  Plan Current plan remains appropriate    Precautions / Restrictions Precautions Precautions: Fall Restrictions Weight Bearing Restrictions: No   Pertinent Vitals/Pain     Mobility  Bed Mobility Bed Mobility: Supine to Sit;Sitting - Scoot to Edge of Bed;Sit to Supine Supine to Sit: 7: Independent Sitting - Scoot to Edge of Bed: 7: Independent Sit to Supine: 7: Independent Transfers Transfers: Sit to Stand;Stand to Sit Sit to Stand: 5: Supervision;From bed Stand to Sit: 5: Supervision;To bed Details for Transfer Assistance: Supervision for safety/balance.  No dizziness with standing.  Balance good with static standing. Ambulation/Gait Ambulation/Gait Assistance: 4: Min guard Ambulation Distance (Feet): 300 Feet Assistive device: None Ambulation/Gait Assistance  Details: Patient continued to feel "off balance" and continued to report oscillopsia.  Gait Pattern: Step-through pattern Gait velocity: WFL    Exercises Other Exercises Other Exercises: x1 exercise in standing Other Exercises: Saccades in sitting Other Exercises: Trunk/cervical rotation with head turns and reaching for target behind patient at shoulder level during gait.     PT Goals (current goals can now be found in the care plan section)    Visit Information  Last PT Received On: 08/20/13 Assistance Needed: +1 History of Present Illness: Patient is a 52 yo female admitted with hyponatremia, hypokalemia, worsening vertigo, and hypotension.     Subjective Data  Subjective: "I'm still having dizziness - it feels like a fullness in my head.  And my eyes bother me when I look in different directions."   Cognition  Cognition Arousal/Alertness: Awake/alert Behavior During Therapy: WFL for tasks assessed/performed Overall Cognitive Status: Within Functional Limits for tasks assessed    Balance     End of Session PT - End of Session Equipment Utilized During Treatment: Gait belt Activity Tolerance: Patient tolerated treatment well Patient left: in bed;with call bell/phone within reach;with family/visitor present Nurse Communication: Mobility status (Need for OP PT for Vestibular Rehab program)   GP     Vena Austria 08/20/2013, 12:19 PM Durenda Hurt. Renaldo Fiddler, [facility] Acute Rehab Services Pager [phone]

## 2013-08-22 ENCOUNTER — Ambulatory Visit: Payer: BC Managed Care – PPO | Attending: Internal Medicine | Admitting: Physical Therapy

## 2013-08-22 DIAGNOSIS — R42 Dizziness and giddiness: Secondary | ICD-10-CM | POA: Insufficient documentation

## 2013-08-22 DIAGNOSIS — IMO0001 Reserved for inherently not codable concepts without codable children: Secondary | ICD-10-CM | POA: Insufficient documentation

## 2013-08-23 ENCOUNTER — Ambulatory Visit: Payer: BC Managed Care – PPO | Admitting: Physical Therapy

## 2013-08-25 ENCOUNTER — Ambulatory Visit: Payer: BC Managed Care – PPO | Admitting: Physical Therapy

## 2013-08-29 ENCOUNTER — Encounter: Payer: BC Managed Care – PPO | Admitting: Physical Therapy

## 2013-09-01 ENCOUNTER — Encounter: Payer: BC Managed Care – PPO | Admitting: Physical Therapy

## 2013-09-05 ENCOUNTER — Encounter: Payer: BC Managed Care – PPO | Admitting: Physical Therapy

## 2013-09-08 ENCOUNTER — Encounter: Payer: BC Managed Care – PPO | Admitting: Physical Therapy

## 2013-09-12 ENCOUNTER — Encounter: Payer: BC Managed Care – PPO | Admitting: Physical Therapy

## 2013-09-19 ENCOUNTER — Ambulatory Visit: Payer: BC Managed Care – PPO | Admitting: Physical Therapy

## 2014-04-12 ENCOUNTER — Other Ambulatory Visit: Payer: Self-pay

## 2014-04-12 DIAGNOSIS — Z1231 Encounter for screening mammogram for malignant neoplasm of breast: Secondary | ICD-10-CM

## 2014-04-20 ENCOUNTER — Ambulatory Visit
Admission: RE | Admit: 2014-04-20 | Discharge: 2014-04-20 | Disposition: A | Discharge status: Home / Self Care | Payer: BC Managed Care – PPO | Source: Ambulatory Visit

## 2014-04-20 DIAGNOSIS — Z1231 Encounter for screening mammogram for malignant neoplasm of breast: Secondary | ICD-10-CM

## 2015-04-09 ENCOUNTER — Other Ambulatory Visit: Payer: Self-pay

## 2015-04-09 DIAGNOSIS — Z1231 Encounter for screening mammogram for malignant neoplasm of breast: Secondary | ICD-10-CM

## 2015-05-03 ENCOUNTER — Ambulatory Visit
Admission: RE | Admit: 2015-05-03 | Discharge: 2015-05-03 | Disposition: A | Discharge status: Home / Self Care | Payer: BLUE CROSS/BLUE SHIELD | Source: Ambulatory Visit

## 2015-05-03 DIAGNOSIS — Z1231 Encounter for screening mammogram for malignant neoplasm of breast: Secondary | ICD-10-CM

## 2015-08-01 DIAGNOSIS — J309 Allergic rhinitis, unspecified: Secondary | ICD-10-CM

## 2015-08-01 DIAGNOSIS — H1010 Acute atopic conjunctivitis, unspecified eye: Secondary | ICD-10-CM

## 2015-08-01 DIAGNOSIS — J4530 Mild persistent asthma, uncomplicated: Secondary | ICD-10-CM

## 2015-09-06 ENCOUNTER — Encounter: Payer: Self-pay | Admitting: Allergy and Immunology

## 2015-09-06 ENCOUNTER — Ambulatory Visit (INDEPENDENT_AMBULATORY_CARE_PROVIDER_SITE_OTHER): Payer: BLUE CROSS/BLUE SHIELD | Admitting: Allergy and Immunology

## 2015-09-06 VITALS — BP 106/68 | HR 72 | Resp 16

## 2015-09-06 DIAGNOSIS — J309 Allergic rhinitis, unspecified: Secondary | ICD-10-CM

## 2015-09-06 DIAGNOSIS — J4530 Mild persistent asthma, uncomplicated: Secondary | ICD-10-CM | POA: Diagnosis not present

## 2015-09-06 DIAGNOSIS — H1010 Acute atopic conjunctivitis, unspecified eye: Secondary | ICD-10-CM | POA: Diagnosis not present

## 2015-09-06 MED ORDER — EPINEPHRINE 0.3 MG/0.3ML IJ SOAJ: 0.3000 mg | Freq: Once | INTRAMUSCULAR | Status: DC

## 2015-09-06 NOTE — Progress Notes (Addendum)
  History of present illness: Renee Hansen returns to the office in follow-up of asthma and allergic rhinoconjunctivitis on immunotherapy (one a month), though she was last seen in 2014.  She reports doing very well and tolerating without large, local and systemic reactions.  She feels they are definitely beneficial and requests to continue injections.  She denies any recurring congestion, drainage, cough, wheeze, sinus infections or new medical concerns, except slight ear pressure in the recent days.  She has been off QVAR in the last year.  She reports her sleep and activity are normal.  She reports no recent albuterol use nor Prednisone or antibiotics.  Outpatient medications: Outpatient Encounter Prescriptions as of 09/06/2015  Medication Sig  . Albuterol Sulfate (VENTOLIN HFA IN) Inhale into the lungs every 6 (six) hours as needed.  . cholecalciferol (VITAMIN D) 1000 UNITS tablet Take 1,000 Units by mouth daily.  Marland Kitchen EPINEPHrine (EPIPEN 2-PAK) 0.3 mg/0.3 mL IJ SOAJ injection Inject 0.3 mg into the muscle once.  . fexofenadine (ALLEGRA ALLERGY) 180 MG tablet Take 180 mg by mouth as needed.   . fish oil-omega-3 fatty acids 1000 MG capsule Take 1 g by mouth daily.  . Multiple Vitamin (MULTIVITAMIN WITH MINERALS) TABS tablet Take 1 tablet by mouth daily.  Marland Kitchen UNABLE TO FIND Allergy Injections  . valACYclovir (VALTREX) 500 MG tablet   . vitamin C (ASCORBIC ACID) 500 MG tablet Take 500 mg by mouth daily.  . vitamin E 400 UNIT capsule Take 400 Units by mouth daily.  . beclomethasone (QVAR) 40 MCG/ACT inhaler Inhale 1 puff into the lungs daily.  . meclizine (ANTIVERT) 25 MG tablet Take 1 tablet (25 mg total) by mouth 3 (three) times daily as needed. (Patient not taking: Reported on 09/06/2015)   No facility-administered encounter medications on file as of 09/06/2015.    Known medication allergies: No Known Allergies  Physical examination: Blood pressure 106/68, pulse 72, resp. rate 16.  General: Alert,  interactive, in no acute distress. HEENT: TMs pearly gray, turbinates minimally edematous, post-pharynx not erythematous. Neck: Supple without lymphadenopathy. Lungs: Clear to auscultation without wheezing, rhonchi or rales. CV: Normal S1, S2 without murmurs. Skin: Warm and dry, without lesions or rashes.  Diagnostics: Spirometry: FVC 3.25--86%, FEV1 2.32--77%.    Assessment: 1.  Allergic rhinoconjunctivitis. 2.  Asthma, mild, well controlled without recent albuterol use.   Plan: 1. Reviewed with Renee Hansen retesting as previously discussed, though she is less interested in this at this time. 2. Continue current medication regime and use Saline nasal wash twice daily for the next, then as needed. 3. Emergency Action Plan in place per protocol and Epi-pen refill.   4. Follow-up in 6-8 months or sooner if needed.    Renee M. Willa Rough, MD

## 2015-09-18 ENCOUNTER — Ambulatory Visit (INDEPENDENT_AMBULATORY_CARE_PROVIDER_SITE_OTHER): Payer: BLUE CROSS/BLUE SHIELD | Admitting: *Deleted

## 2015-09-18 DIAGNOSIS — J309 Allergic rhinitis, unspecified: Secondary | ICD-10-CM | POA: Diagnosis not present

## 2015-10-15 ENCOUNTER — Ambulatory Visit (INDEPENDENT_AMBULATORY_CARE_PROVIDER_SITE_OTHER): Payer: BLUE CROSS/BLUE SHIELD | Admitting: *Deleted

## 2015-10-15 DIAGNOSIS — J309 Allergic rhinitis, unspecified: Secondary | ICD-10-CM

## 2015-11-01 DIAGNOSIS — J301 Allergic rhinitis due to pollen: Secondary | ICD-10-CM | POA: Diagnosis not present

## 2015-11-02 DIAGNOSIS — J3089 Other allergic rhinitis: Secondary | ICD-10-CM | POA: Diagnosis not present

## 2015-11-12 ENCOUNTER — Ambulatory Visit (INDEPENDENT_AMBULATORY_CARE_PROVIDER_SITE_OTHER): Payer: BLUE CROSS/BLUE SHIELD | Admitting: Neurology

## 2015-11-12 DIAGNOSIS — J309 Allergic rhinitis, unspecified: Secondary | ICD-10-CM

## 2015-12-11 ENCOUNTER — Ambulatory Visit (INDEPENDENT_AMBULATORY_CARE_PROVIDER_SITE_OTHER): Payer: BLUE CROSS/BLUE SHIELD | Admitting: *Deleted

## 2015-12-11 DIAGNOSIS — J309 Allergic rhinitis, unspecified: Secondary | ICD-10-CM | POA: Diagnosis not present

## 2015-12-27 ENCOUNTER — Emergency Department (HOSPITAL_COMMUNITY): Payer: BLUE CROSS/BLUE SHIELD

## 2015-12-27 ENCOUNTER — Emergency Department (HOSPITAL_COMMUNITY)
Admission: EM | Admit: 2015-12-27 | Discharge: 2015-12-28 | Disposition: A | Discharge status: Home / Self Care | Payer: BLUE CROSS/BLUE SHIELD | Attending: Emergency Medicine | Admitting: Emergency Medicine

## 2015-12-27 ENCOUNTER — Encounter (HOSPITAL_COMMUNITY): Payer: Self-pay | Admitting: Emergency Medicine

## 2015-12-27 DIAGNOSIS — G43809 Other migraine, not intractable, without status migrainosus: Secondary | ICD-10-CM | POA: Diagnosis not present

## 2015-12-27 DIAGNOSIS — E860 Dehydration: Secondary | ICD-10-CM

## 2015-12-27 DIAGNOSIS — J45909 Unspecified asthma, uncomplicated: Secondary | ICD-10-CM | POA: Insufficient documentation

## 2015-12-27 DIAGNOSIS — Z79899 Other long term (current) drug therapy: Secondary | ICD-10-CM | POA: Insufficient documentation

## 2015-12-27 DIAGNOSIS — R531 Weakness: Secondary | ICD-10-CM | POA: Diagnosis present

## 2015-12-27 DIAGNOSIS — R110 Nausea: Secondary | ICD-10-CM

## 2015-12-27 LAB — CBC WITH DIFFERENTIAL/PLATELET
BASOS PCT: 1 %
Basophils Absolute: 0 10*3/uL (ref 0.0–0.1)
Eosinophils Absolute: 0 10*3/uL (ref 0.0–0.7)
Eosinophils Relative: 0 %
HEMATOCRIT: 37.7 % (ref 36.0–46.0)
Hemoglobin: 12.8 g/dL (ref 12.0–15.0)
Lymphocytes Relative: 43 %
Lymphs Abs: 1.4 10*3/uL (ref 0.7–4.0)
MCH: 30.6 pg (ref 26.0–34.0)
MCHC: 34 g/dL (ref 30.0–36.0)
MCV: 90.2 fL (ref 78.0–100.0)
MONO ABS: 0.5 10*3/uL (ref 0.1–1.0)
Monocytes Relative: 14 %
Neutro Abs: 1.3 10*3/uL — ABNORMAL LOW (ref 1.7–7.7)
Neutrophils Relative %: 42 %
PLATELETS: 178 10*3/uL (ref 150–400)
RBC: 4.18 MIL/uL (ref 3.87–5.11)
RDW: 12 % (ref 11.5–15.5)
WBC: 3.2 10*3/uL — ABNORMAL LOW (ref 4.0–10.5)

## 2015-12-27 MED ORDER — METOCLOPRAMIDE HCL 5 MG/ML IJ SOLN
10.0000 mg | Freq: Once | INTRAMUSCULAR | Status: AC
  Administered 2015-12-27: 10 mg via INTRAVENOUS
  Filled 2015-12-27: qty 2

## 2015-12-27 MED ORDER — MORPHINE SULFATE (PF) 4 MG/ML IV SOLN
4.0000 mg | Freq: Once | INTRAVENOUS | Status: AC
  Administered 2015-12-27: 4 mg via INTRAVENOUS
  Filled 2015-12-27: qty 1

## 2015-12-27 MED ORDER — SODIUM CHLORIDE 0.9 % IV BOLUS (SEPSIS)
1000.0000 mL | Freq: Once | INTRAVENOUS | Status: AC
  Administered 2015-12-27: 1000 mL via INTRAVENOUS

## 2015-12-27 MED ORDER — ONDANSETRON HCL 4 MG/2ML IJ SOLN
4.0000 mg | Freq: Once | INTRAMUSCULAR | Status: AC
Start: 2015-12-27 — End: 2015-12-27
  Administered 2015-12-27: 4 mg via INTRAVENOUS
  Filled 2015-12-27: qty 2

## 2015-12-27 NOTE — ED Notes (Signed)
Patient transported to CT 

## 2015-12-27 NOTE — ED Notes (Signed)
Per EMS: pt c/o nausea and generalized weakness x 3 days. Denies vomiting. Pt has 18 g in left AC - 500 bolus and 4 mg zofran.

## 2015-12-28 LAB — COMPREHENSIVE METABOLIC PANEL
ALK PHOS: 54 U/L (ref 38–126)
ALT: 19 U/L (ref 14–54)
ANION GAP: 9 (ref 5–15)
AST: 27 U/L (ref 15–41)
Albumin: 4 g/dL (ref 3.5–5.0)
BILIRUBIN TOTAL: 0.7 mg/dL (ref 0.3–1.2)
BUN: 12 mg/dL (ref 6–20)
CO2: 24 mmol/L (ref 22–32)
Calcium: 8.6 mg/dL — ABNORMAL LOW (ref 8.9–10.3)
Chloride: 100 mmol/L — ABNORMAL LOW (ref 101–111)
Creatinine, Ser: 0.61 mg/dL (ref 0.44–1.00)
GLUCOSE: 122 mg/dL — AB (ref 65–99)
Potassium: 3.8 mmol/L (ref 3.5–5.1)
SODIUM: 133 mmol/L — AB (ref 135–145)
Total Protein: 6.9 g/dL (ref 6.5–8.1)

## 2015-12-28 MED ORDER — HYDROCODONE-ACETAMINOPHEN 5-325 MG PO TABS: 1.0000 | ORAL_TABLET | Freq: Four times a day (QID) | ORAL | Status: DC | PRN

## 2015-12-28 MED ORDER — ONDANSETRON 8 MG PO TBDP: 8.0000 mg | ORAL_TABLET | Freq: Three times a day (TID) | ORAL | Status: DC | PRN

## 2015-12-28 NOTE — ED Provider Notes (Signed)
CSN: [id number]     Arrival date & time 12/27/15  1920 History   First MD Initiated Contact with Patient 12/27/15 2100     Chief Complaint  Patient presents with  . Weakness  . Nausea      HPI Patient complains of nausea and generalized weakness for the past 3 days.  She denies vomiting.  Denies diarrhea.  Denies abdominal pain or chest pain.  She reports she's had worsening headache over the past 24-48 hours as well.  She feels like fullness sensation behind both of her eyes.  She has had some upper respiratory symptoms with nasal congestion and sinus pressure over the past several days.  Chills without documented fever.  No prior history of migraine headaches.  She does have photophobia now.    Past Medical History  Diagnosis Date  . Asthma    History reviewed. No pertinent past surgical history. History reviewed. No pertinent family history. Social History  Substance Use Topics  . Smoking status: Never Smoker   . Smokeless tobacco: Never Used  . Alcohol Use: No   OB History    No data available     Review of Systems  All other systems reviewed and are negative.     Allergies  Review of patient's allergies indicates no known allergies.  Home Medications   Prior to Admission medications   Medication Sig Start Date End Date Taking? Authorizing Provider  cholecalciferol (VITAMIN D) 1000 UNITS tablet Take 1,000 Units by mouth daily.   Yes Historical Provider, MD  fish oil-omega-3 fatty acids 1000 MG capsule Take 1 g by mouth daily.   Yes Historical Provider, MD  Multiple Vitamin (MULTIVITAMIN WITH MINERALS) TABS tablet Take 1 tablet by mouth daily.   Yes Historical Provider, MD  UNABLE TO FIND Allergy Injections   Yes Historical Provider, MD  vitamin C (ASCORBIC ACID) 500 MG tablet Take 500 mg by mouth daily.   Yes Historical Provider, MD  vitamin E 400 UNIT capsule Take 400 Units by mouth daily.   Yes Historical Provider, MD  EPINEPHrine 0.3 mg/0.3 mL IJ SOAJ injection  Inject 0.3 mLs (0.3 mg total) into the muscle once. 09/06/15   Roselyn Kara Mead, MD  HYDROcodone-acetaminophen (NORCO/VICODIN) 5-325 MG tablet Take 1 tablet by mouth every 6 (six) hours as needed for moderate pain. 12/28/15   Azalia Bilis, MD  meclizine (ANTIVERT) 25 MG tablet Take 1 tablet (25 mg total) by mouth 3 (three) times daily as needed. Patient not taking: Reported on 09/06/2015 08/20/13   Clydia Llano, MD  ondansetron (ZOFRAN ODT) 8 MG disintegrating tablet Take 1 tablet (8 mg total) by mouth every 8 (eight) hours as needed for nausea or vomiting. 12/28/15   Azalia Bilis, MD   BP 132/87 mmHg  Pulse 78  Temp(Src) 97.9 F (36.6 C) (Oral)  Resp 18  SpO2 100% Physical Exam  Constitutional: She is oriented to person, place, and time. She appears well-developed and well-nourished. No distress.  HENT:  Head: Normocephalic and atraumatic.  Eyes: EOM are normal. Pupils are equal, round, and reactive to light.  Neck: Normal range of motion.  Cardiovascular: Normal rate, regular rhythm and normal heart sounds.   Pulmonary/Chest: Effort normal and breath sounds normal.  Abdominal: Soft. She exhibits no distension. There is no tenderness.  Musculoskeletal: Normal range of motion.  Neurological: She is alert and oriented to person, place, and time.  5/5 strength in major muscle groups of  bilateral upper and lower extremities. Speech normal.  No facial asymetry.   Skin: Skin is warm and dry.  Psychiatric: She has a normal mood and affect. Judgment normal.  Nursing note and vitals reviewed.   ED Course  Procedures (including critical care time) Labs Review Labs Reviewed  CBC WITH DIFFERENTIAL/PLATELET - Abnormal; Notable for the following:    WBC 3.2 (*)    Neutro Abs 1.3 (*)    All other components within normal limits  COMPREHENSIVE METABOLIC PANEL - Abnormal; Notable for the following:    Sodium 133 (*)    Chloride 100 (*)    Glucose, Bld 122 (*)    Calcium 8.6 (*)    All other  components within normal limits    Imaging Review Ct Head Wo Contrast  12/27/2015  CLINICAL DATA:  Nausea and generalized weakness for 3 days. Initial encounter. EXAM: CT HEAD WITHOUT CONTRAST TECHNIQUE: Contiguous axial images were obtained from the base of the skull through the vertex without intravenous contrast. COMPARISON:  Brain MRI 08/19/2013. FINDINGS: The brain appears normal without hemorrhage, infarct, mass lesion, mass effect, midline shift or abnormal extra-axial fluid collection. No hydrocephalus or pneumocephalus. Imaged paranasal sinuses and mastoid air cells are clear. Large concha bullosa on the right and nasal septal deviation to the left noted. IMPRESSION: No acute abnormality. Electronically Signed   By: Drusilla Kanner M.D.   On: 12/27/2015 22:26   I have personally reviewed and evaluated these images and lab results as part of my medical decision-making.   EKG Interpretation None      MDM   Final diagnoses:  Nausea  Other type of migraine  Dehydration    12:22 AM Patient feels much better after IV fluids and nausea medicine.  Headache resolved.  Likely migraine headache.  Likely a result of dehydration.  Nausea improved.  Repeat abdominal exam is benign.  Labs with without significant abnormalities.  Discharge home in good condition.  Primary care follow-up.  Patient understands return to the ER for new or worsening symptoms    Azalia Bilis, MD 12/28/15 [phone]

## 2015-12-29 ENCOUNTER — Emergency Department (HOSPITAL_COMMUNITY)
Admission: EM | Admit: 2015-12-29 | Discharge: 2015-12-29 | Disposition: A | Discharge status: Home / Self Care | Payer: BLUE CROSS/BLUE SHIELD | Attending: Emergency Medicine | Admitting: Emergency Medicine

## 2015-12-29 ENCOUNTER — Encounter (HOSPITAL_COMMUNITY): Payer: Self-pay | Admitting: *Deleted

## 2015-12-29 DIAGNOSIS — R110 Nausea: Secondary | ICD-10-CM | POA: Diagnosis not present

## 2015-12-29 DIAGNOSIS — J45909 Unspecified asthma, uncomplicated: Secondary | ICD-10-CM | POA: Diagnosis not present

## 2015-12-29 DIAGNOSIS — R51 Headache: Secondary | ICD-10-CM | POA: Insufficient documentation

## 2015-12-29 DIAGNOSIS — Z79899 Other long term (current) drug therapy: Secondary | ICD-10-CM | POA: Diagnosis not present

## 2015-12-29 DIAGNOSIS — R519 Headache, unspecified: Secondary | ICD-10-CM

## 2015-12-29 LAB — CBC WITH DIFFERENTIAL/PLATELET
Basophils Absolute: 0 10*3/uL (ref 0.0–0.1)
Basophils Relative: 0 %
EOS ABS: 0 10*3/uL (ref 0.0–0.7)
Eosinophils Relative: 0 %
HEMATOCRIT: 38.7 % (ref 36.0–46.0)
Hemoglobin: 13.7 g/dL (ref 12.0–15.0)
Lymphocytes Relative: 30 %
Lymphs Abs: 1.4 10*3/uL (ref 0.7–4.0)
MCH: 31.1 pg (ref 26.0–34.0)
MCHC: 35.4 g/dL (ref 30.0–36.0)
MCV: 88 fL (ref 78.0–100.0)
MONO ABS: 0.7 10*3/uL (ref 0.1–1.0)
MONOS PCT: 14 %
NEUTROS ABS: 2.6 10*3/uL (ref 1.7–7.7)
Neutrophils Relative %: 56 %
PLATELETS: 213 10*3/uL (ref 150–400)
RBC: 4.4 MIL/uL (ref 3.87–5.11)
RDW: 11.7 % (ref 11.5–15.5)
WBC: 4.7 10*3/uL (ref 4.0–10.5)

## 2015-12-29 LAB — URINALYSIS, ROUTINE W REFLEX MICROSCOPIC
BILIRUBIN URINE: NEGATIVE
GLUCOSE, UA: NEGATIVE mg/dL
Ketones, ur: 15 mg/dL — AB
Leukocytes, UA: NEGATIVE
NITRITE: NEGATIVE
PH: 6.5 (ref 5.0–8.0)
PROTEIN: NEGATIVE mg/dL
Specific Gravity, Urine: 1.007 (ref 1.005–1.030)

## 2015-12-29 LAB — BASIC METABOLIC PANEL
ANION GAP: 11 (ref 5–15)
BUN: 12 mg/dL (ref 6–20)
CO2: 26 mmol/L (ref 22–32)
CREATININE: 0.79 mg/dL (ref 0.44–1.00)
Calcium: 9.7 mg/dL (ref 8.9–10.3)
Chloride: 100 mmol/L — ABNORMAL LOW (ref 101–111)
GFR calc Af Amer: >60 mL/min (ref >60)
GLUCOSE: 101 mg/dL — AB (ref 65–99)
Potassium: 3.8 mmol/L (ref 3.5–5.1)
Sodium: 137 mmol/L (ref 135–145)

## 2015-12-29 LAB — URINE MICROSCOPIC-ADD ON
Bacteria, UA: NONE SEEN
WBC, UA: NONE SEEN WBC/hpf (ref 0–5)

## 2015-12-29 MED ORDER — DIPHENHYDRAMINE HCL 50 MG/ML IJ SOLN
25.0000 mg | Freq: Once | INTRAMUSCULAR | Status: AC
  Administered 2015-12-29: 25 mg via INTRAVENOUS
  Filled 2015-12-29: qty 1

## 2015-12-29 MED ORDER — METOCLOPRAMIDE HCL 5 MG/ML IJ SOLN
10.0000 mg | Freq: Once | INTRAMUSCULAR | Status: AC
  Administered 2015-12-29: 10 mg via INTRAVENOUS
  Filled 2015-12-29: qty 2

## 2015-12-29 MED ORDER — SODIUM CHLORIDE 0.9 % IV BOLUS (SEPSIS)
1000.0000 mL | Freq: Once | INTRAVENOUS | Status: AC
  Administered 2015-12-29: 1000 mL via INTRAVENOUS

## 2015-12-29 MED ORDER — DEXAMETHASONE SODIUM PHOSPHATE 10 MG/ML IJ SOLN
10.0000 mg | Freq: Once | INTRAMUSCULAR | Status: AC
  Administered 2015-12-29: 10 mg via INTRAVENOUS
  Filled 2015-12-29: qty 1

## 2015-12-29 MED ORDER — KETOROLAC TROMETHAMINE 15 MG/ML IJ SOLN
15.0000 mg | Freq: Once | INTRAMUSCULAR | Status: AC
  Administered 2015-12-29: 15 mg via INTRAVENOUS
  Filled 2015-12-29: qty 1

## 2015-12-29 NOTE — ED Provider Notes (Signed)
CSN: [id number]     Arrival date & time 12/29/15  4098 History   First MD Initiated Contact with Patient 12/29/15 [phone]     Chief Complaint  Patient presents with  . Nausea  . Headache     Patient is a 55 y.o. female presenting with headaches. The history is provided by the patient. No language interpreter was used.  Headache  Renee Hansen is a 55 y.o. female who presents to the Emergency Department complaining of  Nausea and headache. Renee Hansen  Presents today for nausea and headache that began last night. A week ago Renee Hansen reports flulike symptoms with fevers, nausea, headache. Her symptoms resolved 3 days ago and then Renee Hansen developed recurrent headache 2 days ago was seen in the emergency department. Her headache resolved after treatments and Renee Hansen went home and  Felt improved. Yesterday Renee Hansen had no headache in the morning but did have some nausea. Last night her headache returned. The headache is pressure behind bilateral eyes in the ears. It is a constant discomfort. Renee Hansen feels clammy and hot at times. No vomiting, vision changes, weakness, abdominal pain, dysuria, diarrhea. Symptoms are moderate, waxing waning, worsening.  Past Medical History  Diagnosis Date  . Asthma    History reviewed. No pertinent past surgical history. No family history on file. Social History  Substance Use Topics  . Smoking status: Never Smoker   . Smokeless tobacco: Never Used  . Alcohol Use: No   OB History    No data available     Review of Systems  Neurological: Positive for headaches.  All other systems reviewed and are negative.     Allergies  Chlorthalidone  Home Medications   Prior to Admission medications   Medication Sig Start Date End Date Taking? Authorizing Provider  cholecalciferol (VITAMIN D) 1000 UNITS tablet Take 1,000 Units by mouth daily.   Yes Historical Provider, MD  EPINEPHrine 0.3 mg/0.3 mL IJ SOAJ injection Inject 0.3 mLs (0.3 mg total) into the muscle once. 09/06/15  Yes Roselyn  Kara Mead, MD  fish oil-omega-3 fatty acids 1000 MG capsule Take 1 g by mouth daily.   Yes Historical Provider, MD  HYDROcodone-acetaminophen (NORCO/VICODIN) 5-325 MG tablet Take 1 tablet by mouth every 6 (six) hours as needed for moderate pain. 12/28/15  Yes Azalia Bilis, MD  Multiple Vitamin (MULTIVITAMIN WITH MINERALS) TABS tablet Take 1 tablet by mouth daily.   Yes Historical Provider, MD  ondansetron (ZOFRAN ODT) 8 MG disintegrating tablet Take 1 tablet (8 mg total) by mouth every 8 (eight) hours as needed for nausea or vomiting. 12/28/15  Yes Azalia Bilis, MD  UNABLE TO FIND Allergy Injections   Yes Historical Provider, MD  vitamin C (ASCORBIC ACID) 500 MG tablet Take 500 mg by mouth daily.   Yes Historical Provider, MD  vitamin E 400 UNIT capsule Take 400 Units by mouth daily.   Yes Historical Provider, MD  meclizine (ANTIVERT) 25 MG tablet Take 1 tablet (25 mg total) by mouth 3 (three) times daily as needed. Patient not taking: Reported on 09/06/2015 08/20/13   Clydia Llano, MD   BP 127/77 mmHg  Pulse 70  Temp(Src) 97.5 F (36.4 C) (Oral)  Resp 16  SpO2 99% Physical Exam  Constitutional: Renee Hansen is oriented to person, place, and time. Renee Hansen appears well-developed and well-nourished.  HENT:  Head: Normocephalic and atraumatic.  Right Ear: External ear normal.  Left Ear: External ear normal.  Mouth/Throat: Oropharynx is clear and moist.  Eyes: EOM are normal.  Pupils are equal, round, and reactive to light.  Neck: Neck supple.  Cardiovascular: Normal rate and regular rhythm.   No murmur heard. Pulmonary/Chest: Effort normal and breath sounds normal. No respiratory distress.  Abdominal: Soft. There is no tenderness. There is no rebound and no guarding.  Musculoskeletal: Renee Hansen exhibits no edema or tenderness.  Neurological: Renee Hansen is alert and oriented to person, place, and time. No cranial nerve deficit.  Skin: Skin is warm and dry.  Psychiatric: Renee Hansen has a normal mood and affect. Her behavior is  normal.  Nursing note and vitals reviewed.   ED Course  Procedures (including critical care time) Labs Review Labs Reviewed  BASIC METABOLIC PANEL - Abnormal; Notable for the following:    Chloride 100 (*)    Glucose, Bld 101 (*)    All other components within normal limits  URINALYSIS, ROUTINE W REFLEX MICROSCOPIC (NOT AT [facility]) - Abnormal; Notable for the following:    Hgb urine dipstick TRACE (*)    Ketones, ur 15 (*)    All other components within normal limits  URINE MICROSCOPIC-ADD ON - Abnormal; Notable for the following:    Squamous Epithelial / LPF 0-5 (*)    All other components within normal limits  CBC WITH DIFFERENTIAL/PLATELET    Imaging Review Ct Head Wo Contrast  12/27/2015  CLINICAL DATA:  Nausea and generalized weakness for 3 days. Initial encounter. EXAM: CT HEAD WITHOUT CONTRAST TECHNIQUE: Contiguous axial images were obtained from the base of the skull through the vertex without intravenous contrast. COMPARISON:  Brain MRI 08/19/2013. FINDINGS: The brain appears normal without hemorrhage, infarct, mass lesion, mass effect, midline shift or abnormal extra-axial fluid collection. No hydrocephalus or pneumocephalus. Imaged paranasal sinuses and mastoid air cells are clear. Large concha bullosa on the right and nasal septal deviation to the left noted. IMPRESSION: No acute abnormality. Electronically Signed   By: Drusilla Kanner M.D.   On: 12/27/2015 22:26   I have personally reviewed and evaluated these images and lab results as part of my medical decision-making.   EKG Interpretation None      MDM   Final diagnoses:  Nausea  Bad headache   patient here for evaluation of nausea and headache, recently seen and evaluated emergency department a few days ago for similar symptoms with negative CT head. Nontoxic appearing on examination with no focal neurologic deficits. Presentation is not consistent with meningitis, subarachnoid hemorrhage, CVA, acute otitis media.  Renee Hansen is improved on repeat evaluation after headache cocktail. Discussed with patient home care for nausea and headache as well as close outpatient follow-up and return precautions.    Tilden Fossa, MD 12/29/15 [phone]

## 2015-12-29 NOTE — ED Notes (Signed)
Bed: WA19 Expected date:  Expected time:  Means of arrival:  Comments: 

## 2015-12-29 NOTE — ED Notes (Signed)
Pt ambulated to BR and back to room w/o difficulty or assistance

## 2015-12-29 NOTE — ED Notes (Signed)
Pt reports h/a and nausea since last night.  Pt reports being seen here Thursday for same and had a CT done which was negative.  Pt reports pressure in bila ears which she was also seen for Thursday.  Pt also reports feeling fatigued and "clammy" feeling.  Pt reports pressure behind bila eyes and ears, same as 2 days ago.  SHe reports she was found to have fluid in her R ear.

## 2015-12-29 NOTE — Discharge Instructions (Signed)
General Headache Without Cause A headache is pain or discomfort felt around the head or neck area. The specific cause of a headache may not be found. There are many causes and types of headaches. A few common ones are:  Tension headaches.  Migraine headaches.  Cluster headaches.  Chronic daily headaches. HOME CARE INSTRUCTIONS  Watch your condition for any changes. Take these steps to help with your condition: Managing Pain  Take over-the-counter and prescription medicines only as told by your health care provider.  Lie down in a dark, quiet room when you have a headache.  If directed, apply ice to the head and neck area:  Put ice in a plastic bag.  Place a towel between your skin and the bag.  Leave the ice on for 20 minutes, 2-3 times per day.  Use a heating pad or hot shower to apply heat to the head and neck area as told by your health care provider.  Keep lights dim if bright lights bother you or make your headaches worse. Eating and Drinking  Eat meals on a regular schedule.  Limit alcohol use.  Decrease the amount of caffeine you drink, or stop drinking caffeine. General Instructions  Keep all follow-up visits as told by your health care provider. This is important.  Keep a headache journal to help find out what may trigger your headaches. For example, write down:  What you eat and drink.  How much sleep you get.  Any change to your diet or medicines.  Try massage or other relaxation techniques.  Limit stress.  Sit up straight, and do not tense your muscles.  Do not use tobacco products, including cigarettes, chewing tobacco, or e-cigarettes. If you need help quitting, ask your health care provider.  Exercise regularly as told by your health care provider.  Sleep on a regular schedule. Get 7-9 hours of sleep, or the amount recommended by your health care provider. SEEK MEDICAL CARE IF:   Your symptoms are not helped by medicine.  You have a  headache that is different from the usual headache.  You have nausea or you vomit.  You have a fever. SEEK IMMEDIATE MEDICAL CARE IF:   Your headache becomes severe.  You have repeated vomiting.  You have a stiff neck.  You have a loss of vision.  You have problems with speech.  You have pain in the eye or ear.  You have muscular weakness or loss of muscle control.  You lose your balance or have trouble walking.  You feel faint or pass out.  You have confusion.   This information is not intended to replace advice given to you by your health care provider. Make sure you discuss any questions you have with your health care provider.   Document Released: 11/17/2005 Document Revised: 08/08/2015 Document Reviewed: 03/12/2015 Elsevier Interactive Patient Education 2016 [employer]  Nausea, Adult Nausea is the feeling that you have an upset stomach or have to vomit. Nausea by itself is not likely a serious concern, but it may be an early sign of more serious medical problems. As nausea gets worse, it can lead to vomiting. If vomiting develops, there is the risk of dehydration.  CAUSES   Viral infections.  Food poisoning.  Medicines.  Pregnancy.  Motion sickness.  Migraine headaches.  Emotional distress.  Severe pain from any source.  Alcohol intoxication. HOME CARE INSTRUCTIONS  Get plenty of rest.  Ask your caregiver about specific rehydration instructions.  Eat small amounts of  and sip liquids more often. °· Take all medicines as told by your caregiver. °SEEK MEDICAL CARE IF: °· You have not improved after 2 days, or you get worse. °· You have a headache. °SEEK IMMEDIATE MEDICAL CARE IF:  °· You have a fever. °· You faint. °· You keep vomiting or have blood in your vomit. °· You are extremely weak or dehydrated. °· You have dark or bloody stools. °· You have severe chest or abdominal pain. °MAKE SURE YOU: °· Understand these instructions. °· Will watch your  condition. °· Will get help right away if you are not doing well or get worse. °  °This information is not intended to replace advice given to you by your health care provider. Make sure you discuss any questions you have with your health care provider. °  °Document Released: 12/25/2004 Document Revised: 12/08/2014 Document Reviewed: 07/30/2011 °Elsevier Interactive Patient Education ©2016 Elsevier Inc. ° °

## 2016-01-08 ENCOUNTER — Ambulatory Visit (INDEPENDENT_AMBULATORY_CARE_PROVIDER_SITE_OTHER): Payer: BLUE CROSS/BLUE SHIELD

## 2016-01-08 DIAGNOSIS — J309 Allergic rhinitis, unspecified: Secondary | ICD-10-CM

## 2016-02-05 ENCOUNTER — Ambulatory Visit (INDEPENDENT_AMBULATORY_CARE_PROVIDER_SITE_OTHER): Payer: BLUE CROSS/BLUE SHIELD

## 2016-02-05 DIAGNOSIS — J309 Allergic rhinitis, unspecified: Secondary | ICD-10-CM

## 2016-02-12 ENCOUNTER — Ambulatory Visit (INDEPENDENT_AMBULATORY_CARE_PROVIDER_SITE_OTHER): Payer: BLUE CROSS/BLUE SHIELD | Admitting: *Deleted

## 2016-02-12 DIAGNOSIS — J309 Allergic rhinitis, unspecified: Secondary | ICD-10-CM

## 2016-02-19 ENCOUNTER — Ambulatory Visit (INDEPENDENT_AMBULATORY_CARE_PROVIDER_SITE_OTHER): Payer: BLUE CROSS/BLUE SHIELD

## 2016-02-19 DIAGNOSIS — J309 Allergic rhinitis, unspecified: Secondary | ICD-10-CM | POA: Diagnosis not present

## 2016-02-27 ENCOUNTER — Ambulatory Visit: Payer: Self-pay

## 2016-02-27 DIAGNOSIS — J309 Allergic rhinitis, unspecified: Secondary | ICD-10-CM

## 2016-03-04 ENCOUNTER — Ambulatory Visit (INDEPENDENT_AMBULATORY_CARE_PROVIDER_SITE_OTHER): Payer: BLUE CROSS/BLUE SHIELD

## 2016-03-04 DIAGNOSIS — J309 Allergic rhinitis, unspecified: Secondary | ICD-10-CM | POA: Diagnosis not present

## 2016-03-11 ENCOUNTER — Ambulatory Visit (INDEPENDENT_AMBULATORY_CARE_PROVIDER_SITE_OTHER): Payer: BLUE CROSS/BLUE SHIELD | Admitting: *Deleted

## 2016-03-11 DIAGNOSIS — J309 Allergic rhinitis, unspecified: Secondary | ICD-10-CM

## 2016-03-18 ENCOUNTER — Ambulatory Visit (INDEPENDENT_AMBULATORY_CARE_PROVIDER_SITE_OTHER): Payer: BLUE CROSS/BLUE SHIELD

## 2016-03-18 DIAGNOSIS — J309 Allergic rhinitis, unspecified: Secondary | ICD-10-CM

## 2016-03-27 ENCOUNTER — Ambulatory Visit (INDEPENDENT_AMBULATORY_CARE_PROVIDER_SITE_OTHER): Payer: BLUE CROSS/BLUE SHIELD

## 2016-03-27 DIAGNOSIS — J309 Allergic rhinitis, unspecified: Secondary | ICD-10-CM | POA: Diagnosis not present

## 2016-04-02 DIAGNOSIS — J301 Allergic rhinitis due to pollen: Secondary | ICD-10-CM | POA: Diagnosis not present

## 2016-04-03 DIAGNOSIS — J3089 Other allergic rhinitis: Secondary | ICD-10-CM | POA: Diagnosis not present

## 2016-04-15 ENCOUNTER — Other Ambulatory Visit: Payer: Self-pay

## 2016-04-15 DIAGNOSIS — Z1231 Encounter for screening mammogram for malignant neoplasm of breast: Secondary | ICD-10-CM

## 2016-04-24 ENCOUNTER — Ambulatory Visit (INDEPENDENT_AMBULATORY_CARE_PROVIDER_SITE_OTHER): Payer: BLUE CROSS/BLUE SHIELD

## 2016-04-24 DIAGNOSIS — J309 Allergic rhinitis, unspecified: Secondary | ICD-10-CM | POA: Diagnosis not present

## 2016-05-08 ENCOUNTER — Ambulatory Visit
Admission: RE | Admit: 2016-05-08 | Discharge: 2016-05-08 | Disposition: A | Discharge status: Home / Self Care | Payer: BLUE CROSS/BLUE SHIELD | Source: Ambulatory Visit

## 2016-05-08 DIAGNOSIS — Z1231 Encounter for screening mammogram for malignant neoplasm of breast: Secondary | ICD-10-CM | POA: Diagnosis not present

## 2016-05-09 DIAGNOSIS — H6983 Other specified disorders of Eustachian tube, bilateral: Secondary | ICD-10-CM | POA: Diagnosis not present

## 2016-05-09 DIAGNOSIS — J309 Allergic rhinitis, unspecified: Secondary | ICD-10-CM | POA: Diagnosis not present

## 2016-05-22 ENCOUNTER — Ambulatory Visit (INDEPENDENT_AMBULATORY_CARE_PROVIDER_SITE_OTHER): Payer: BLUE CROSS/BLUE SHIELD

## 2016-05-22 DIAGNOSIS — J309 Allergic rhinitis, unspecified: Secondary | ICD-10-CM | POA: Diagnosis not present

## 2016-05-29 DIAGNOSIS — Z130 Encounter for screening for diseases of the blood and blood-forming organs and certain disorders involving the immune mechanism: Secondary | ICD-10-CM | POA: Diagnosis not present

## 2016-05-29 DIAGNOSIS — Z780 Asymptomatic menopausal state: Secondary | ICD-10-CM | POA: Diagnosis not present

## 2016-05-29 DIAGNOSIS — Z1389 Encounter for screening for other disorder: Secondary | ICD-10-CM | POA: Diagnosis not present

## 2016-05-29 DIAGNOSIS — Z1151 Encounter for screening for human papillomavirus (HPV): Secondary | ICD-10-CM | POA: Diagnosis not present

## 2016-05-29 DIAGNOSIS — Z124 Encounter for screening for malignant neoplasm of cervix: Secondary | ICD-10-CM | POA: Diagnosis not present

## 2016-05-29 DIAGNOSIS — Z01419 Encounter for gynecological examination (general) (routine) without abnormal findings: Secondary | ICD-10-CM | POA: Diagnosis not present

## 2016-05-29 DIAGNOSIS — Z681 Body mass index (BMI) 19 or less, adult: Secondary | ICD-10-CM | POA: Diagnosis not present

## 2016-06-20 ENCOUNTER — Ambulatory Visit (INDEPENDENT_AMBULATORY_CARE_PROVIDER_SITE_OTHER): Payer: BLUE CROSS/BLUE SHIELD

## 2016-06-20 DIAGNOSIS — J309 Allergic rhinitis, unspecified: Secondary | ICD-10-CM

## 2016-07-17 ENCOUNTER — Ambulatory Visit (INDEPENDENT_AMBULATORY_CARE_PROVIDER_SITE_OTHER): Payer: BLUE CROSS/BLUE SHIELD

## 2016-07-17 DIAGNOSIS — J309 Allergic rhinitis, unspecified: Secondary | ICD-10-CM | POA: Diagnosis not present

## 2016-07-17 IMAGING — MG MM SCREENING BREAST TOMO BILATERAL
8 series · 9 of 24 positions shown · non-contrast
Comparison: Previous exam(s).

CLINICAL DATA: Screening.

EXAM:
DIGITAL SCREENING BILATERAL MAMMOGRAM WITH 3D TOMO WITH CAD

[L CC]
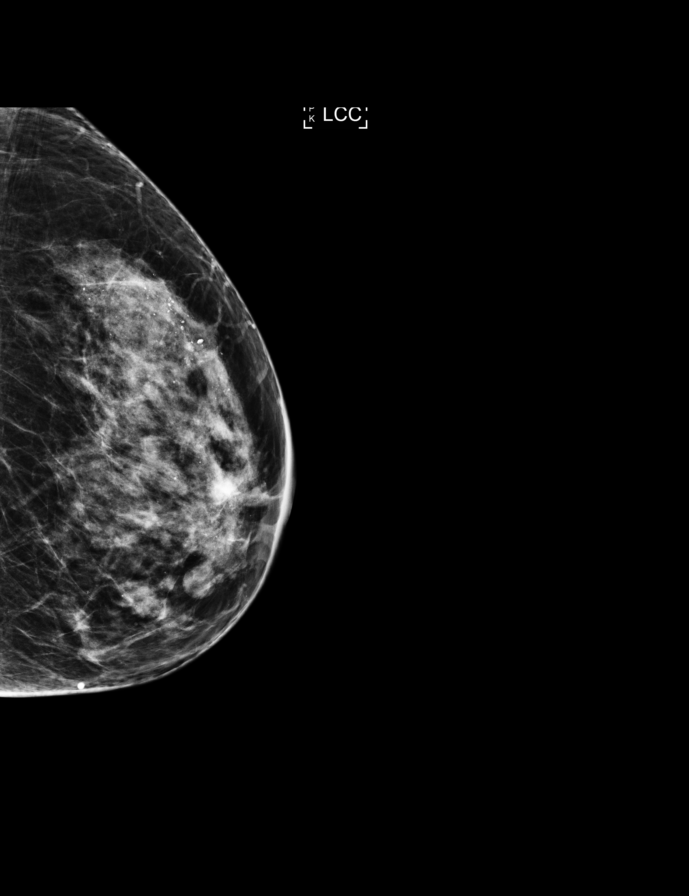

[R MLO]
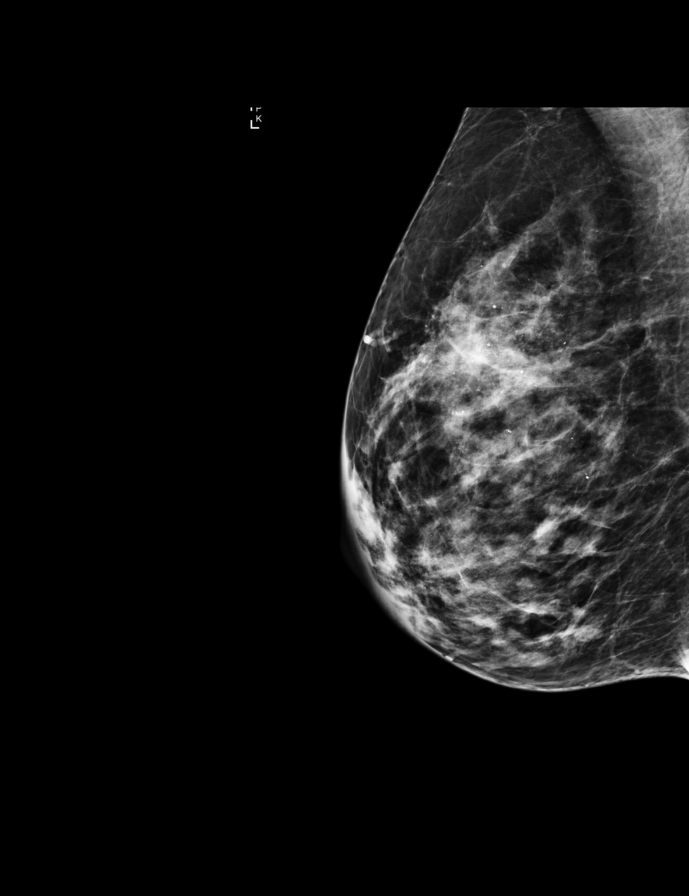

[R CC]
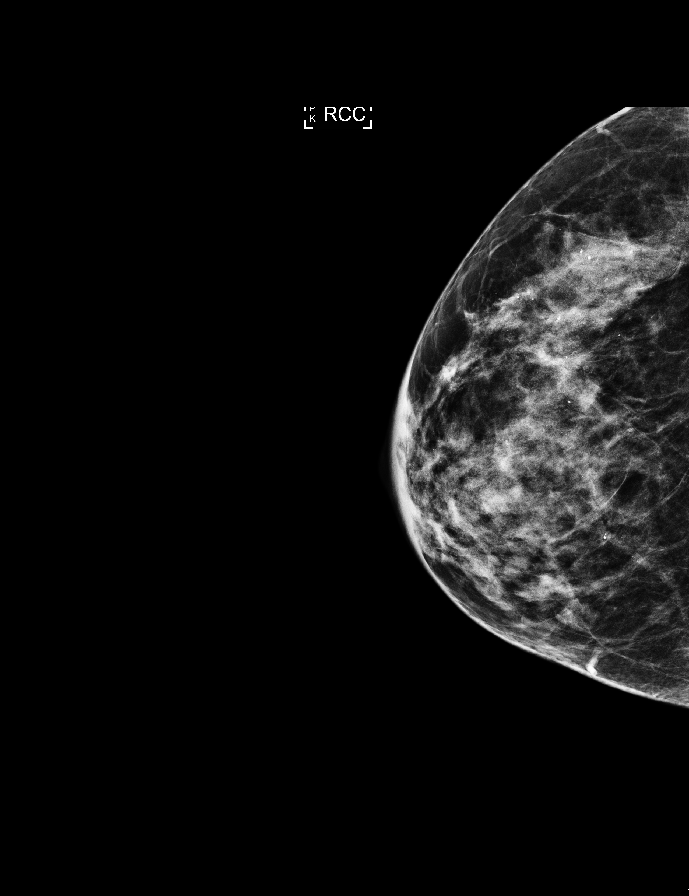

[L MLO]
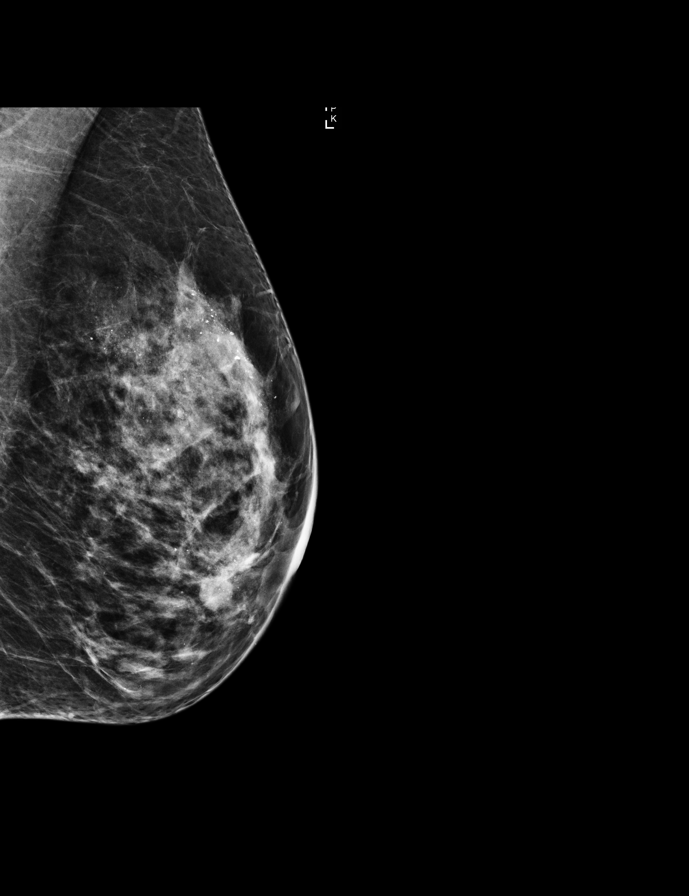

[R CC tomo · 2 of 56 frames shown]
[frame 19/56]
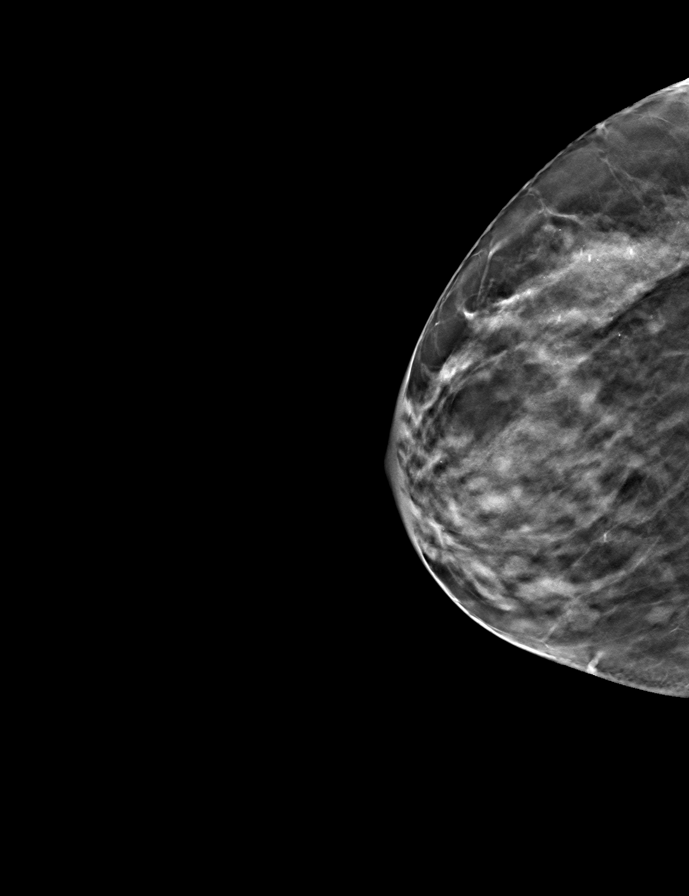
[frame 29/56]
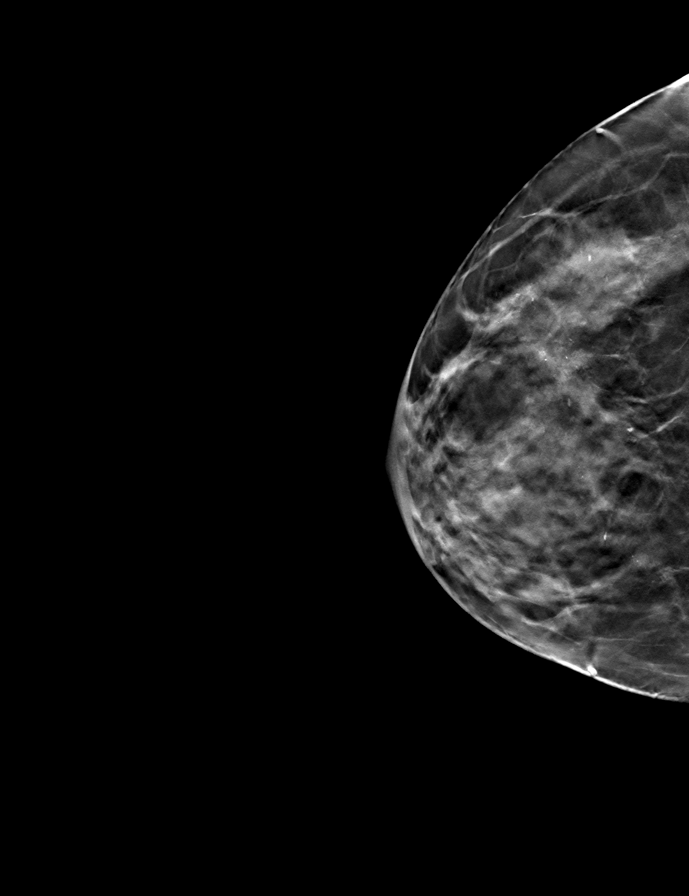

[R MLO tomo · tomo slice 27/52.0]
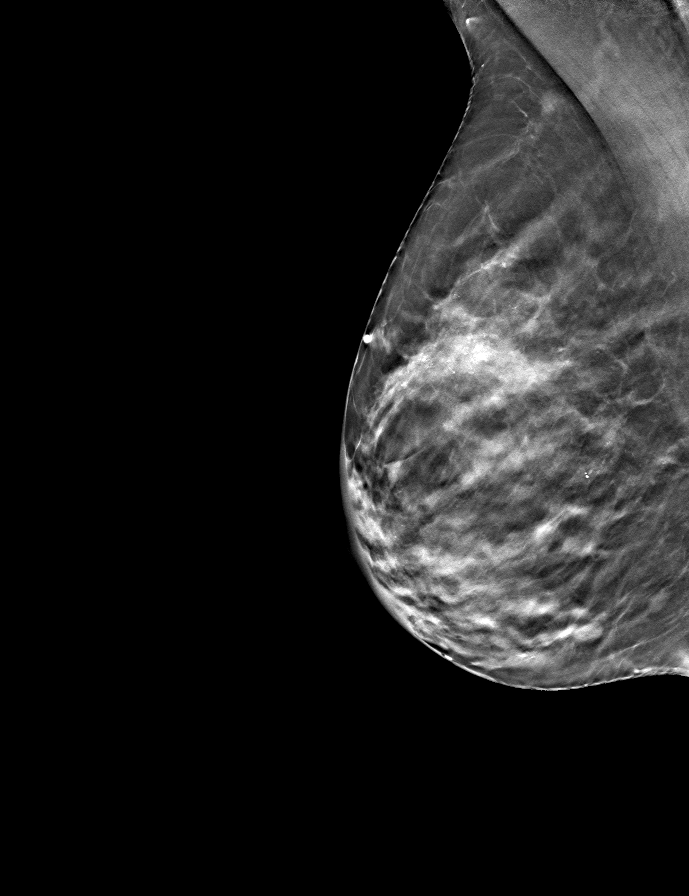

[L MLO tomo · tomo slice 25/50.0]
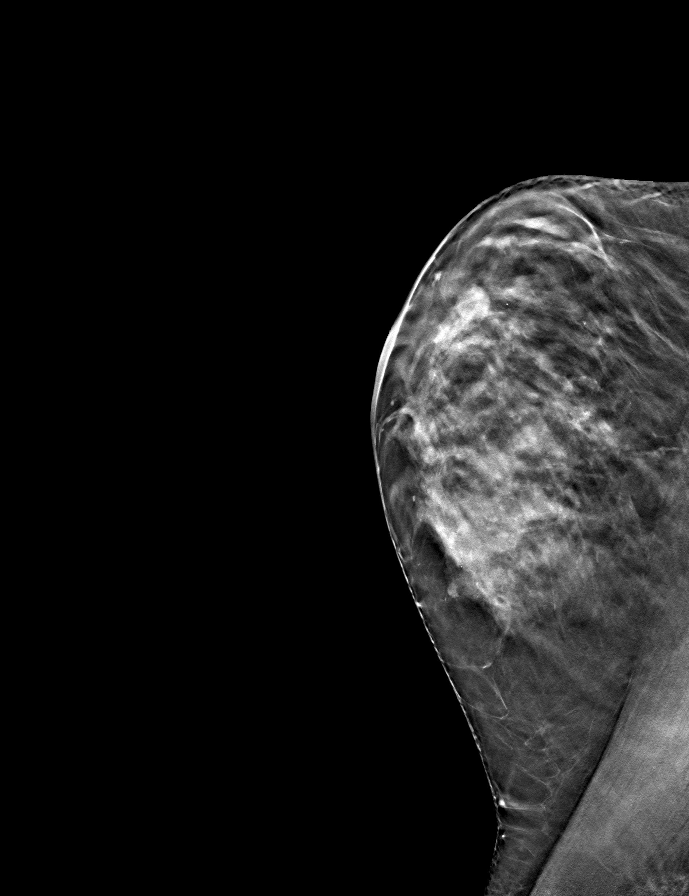

[L CC tomo · tomo slice 29/57.0]
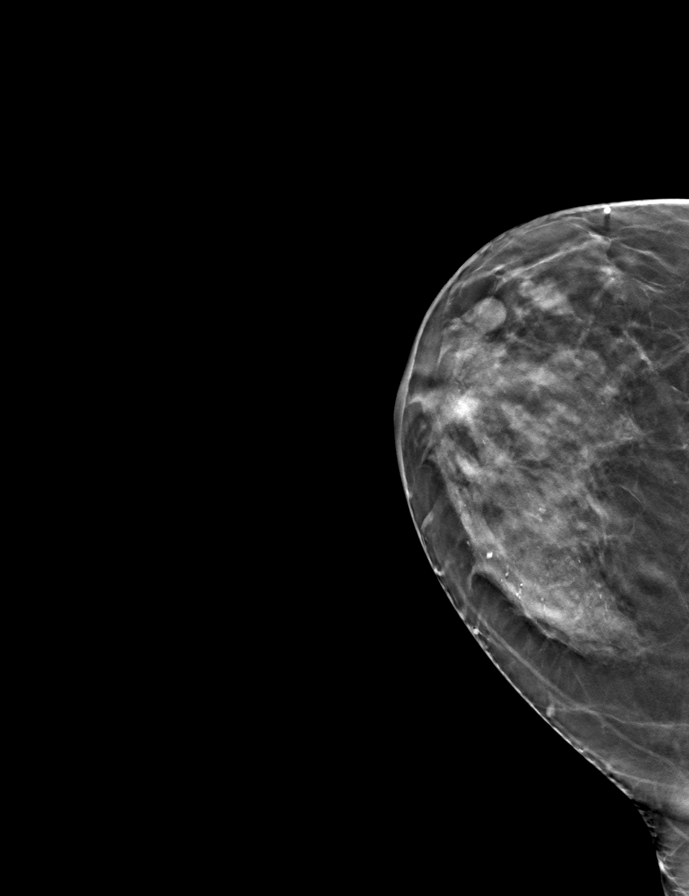

[9 of 24 positions shown; findings below may reference images not displayed]

ACR Breast Density Category c: The breast tissue is heterogeneously
dense, which may obscure small masses.
FINDINGS: There are no findings suspicious for malignancy. Images were
processed with CAD.
IMPRESSION: No mammographic evidence of malignancy. A result letter of this
screening mammogram will be mailed directly to the patient.

RECOMMENDATION:
Screening mammogram in one year. (Code:OA-G-1SS)

BI-RADS CATEGORY  1: Negative.

## 2016-07-24 ENCOUNTER — Ambulatory Visit (INDEPENDENT_AMBULATORY_CARE_PROVIDER_SITE_OTHER): Payer: BLUE CROSS/BLUE SHIELD

## 2016-07-24 DIAGNOSIS — J309 Allergic rhinitis, unspecified: Secondary | ICD-10-CM

## 2016-07-31 ENCOUNTER — Ambulatory Visit (INDEPENDENT_AMBULATORY_CARE_PROVIDER_SITE_OTHER): Payer: BLUE CROSS/BLUE SHIELD | Admitting: *Deleted

## 2016-07-31 DIAGNOSIS — J309 Allergic rhinitis, unspecified: Secondary | ICD-10-CM | POA: Diagnosis not present

## 2016-08-07 ENCOUNTER — Ambulatory Visit (INDEPENDENT_AMBULATORY_CARE_PROVIDER_SITE_OTHER): Payer: BLUE CROSS/BLUE SHIELD

## 2016-08-07 DIAGNOSIS — J309 Allergic rhinitis, unspecified: Secondary | ICD-10-CM

## 2016-08-14 ENCOUNTER — Ambulatory Visit (INDEPENDENT_AMBULATORY_CARE_PROVIDER_SITE_OTHER): Payer: BLUE CROSS/BLUE SHIELD | Admitting: *Deleted

## 2016-08-14 DIAGNOSIS — J309 Allergic rhinitis, unspecified: Secondary | ICD-10-CM

## 2016-08-26 DIAGNOSIS — Z0000 Encounter for general adult medical examination without abnormal findings: Secondary | ICD-10-CM | POA: Diagnosis not present

## 2016-08-26 DIAGNOSIS — J301 Allergic rhinitis due to pollen: Secondary | ICD-10-CM | POA: Diagnosis not present

## 2016-08-26 DIAGNOSIS — J4520 Mild intermittent asthma, uncomplicated: Secondary | ICD-10-CM | POA: Diagnosis not present

## 2016-08-26 DIAGNOSIS — B009 Herpesviral infection, unspecified: Secondary | ICD-10-CM | POA: Diagnosis not present

## 2016-09-11 ENCOUNTER — Ambulatory Visit (INDEPENDENT_AMBULATORY_CARE_PROVIDER_SITE_OTHER): Payer: BLUE CROSS/BLUE SHIELD | Admitting: *Deleted

## 2016-09-11 DIAGNOSIS — H1010 Acute atopic conjunctivitis, unspecified eye: Secondary | ICD-10-CM

## 2016-09-11 DIAGNOSIS — J309 Allergic rhinitis, unspecified: Secondary | ICD-10-CM

## 2016-10-09 ENCOUNTER — Ambulatory Visit (INDEPENDENT_AMBULATORY_CARE_PROVIDER_SITE_OTHER): Payer: BLUE CROSS/BLUE SHIELD | Admitting: *Deleted

## 2016-10-09 DIAGNOSIS — H1010 Acute atopic conjunctivitis, unspecified eye: Secondary | ICD-10-CM

## 2016-10-09 DIAGNOSIS — J309 Allergic rhinitis, unspecified: Secondary | ICD-10-CM

## 2016-11-06 ENCOUNTER — Ambulatory Visit (INDEPENDENT_AMBULATORY_CARE_PROVIDER_SITE_OTHER): Payer: BLUE CROSS/BLUE SHIELD

## 2016-11-06 DIAGNOSIS — J309 Allergic rhinitis, unspecified: Secondary | ICD-10-CM

## 2016-11-06 DIAGNOSIS — H1010 Acute atopic conjunctivitis, unspecified eye: Secondary | ICD-10-CM | POA: Diagnosis not present

## 2016-12-09 ENCOUNTER — Ambulatory Visit (INDEPENDENT_AMBULATORY_CARE_PROVIDER_SITE_OTHER): Payer: BLUE CROSS/BLUE SHIELD | Admitting: *Deleted

## 2016-12-09 DIAGNOSIS — J309 Allergic rhinitis, unspecified: Secondary | ICD-10-CM

## 2016-12-09 DIAGNOSIS — H1010 Acute atopic conjunctivitis, unspecified eye: Secondary | ICD-10-CM

## 2016-12-11 DIAGNOSIS — J3089 Other allergic rhinitis: Secondary | ICD-10-CM | POA: Diagnosis not present

## 2016-12-12 DIAGNOSIS — J301 Allergic rhinitis due to pollen: Secondary | ICD-10-CM | POA: Diagnosis not present

## 2016-12-19 NOTE — Addendum Note (Signed)
Addended by: Maryjean MornFREEMAN, Julieana Eshleman D on: 12/19/2016 11:58 AM   Modules accepted: Orders

## 2017-01-06 ENCOUNTER — Ambulatory Visit (INDEPENDENT_AMBULATORY_CARE_PROVIDER_SITE_OTHER): Payer: BLUE CROSS/BLUE SHIELD

## 2017-01-06 DIAGNOSIS — J309 Allergic rhinitis, unspecified: Secondary | ICD-10-CM

## 2017-02-26 ENCOUNTER — Ambulatory Visit (INDEPENDENT_AMBULATORY_CARE_PROVIDER_SITE_OTHER): Payer: BLUE CROSS/BLUE SHIELD | Admitting: *Deleted

## 2017-02-26 DIAGNOSIS — J309 Allergic rhinitis, unspecified: Secondary | ICD-10-CM | POA: Diagnosis not present

## 2017-03-05 ENCOUNTER — Ambulatory Visit (INDEPENDENT_AMBULATORY_CARE_PROVIDER_SITE_OTHER): Payer: BLUE CROSS/BLUE SHIELD | Admitting: *Deleted

## 2017-03-05 DIAGNOSIS — J309 Allergic rhinitis, unspecified: Secondary | ICD-10-CM | POA: Diagnosis not present

## 2017-03-12 ENCOUNTER — Ambulatory Visit (INDEPENDENT_AMBULATORY_CARE_PROVIDER_SITE_OTHER): Payer: BLUE CROSS/BLUE SHIELD | Admitting: *Deleted

## 2017-03-12 DIAGNOSIS — J309 Allergic rhinitis, unspecified: Secondary | ICD-10-CM | POA: Diagnosis not present

## 2017-03-19 ENCOUNTER — Ambulatory Visit (INDEPENDENT_AMBULATORY_CARE_PROVIDER_SITE_OTHER): Payer: BLUE CROSS/BLUE SHIELD

## 2017-03-19 DIAGNOSIS — J309 Allergic rhinitis, unspecified: Secondary | ICD-10-CM

## 2017-03-26 ENCOUNTER — Ambulatory Visit (INDEPENDENT_AMBULATORY_CARE_PROVIDER_SITE_OTHER): Payer: BLUE CROSS/BLUE SHIELD | Admitting: *Deleted

## 2017-03-26 DIAGNOSIS — J309 Allergic rhinitis, unspecified: Secondary | ICD-10-CM

## 2017-04-23 ENCOUNTER — Ambulatory Visit (INDEPENDENT_AMBULATORY_CARE_PROVIDER_SITE_OTHER): Payer: BLUE CROSS/BLUE SHIELD | Admitting: *Deleted

## 2017-04-23 DIAGNOSIS — J309 Allergic rhinitis, unspecified: Secondary | ICD-10-CM | POA: Diagnosis not present

## 2017-05-21 ENCOUNTER — Ambulatory Visit (INDEPENDENT_AMBULATORY_CARE_PROVIDER_SITE_OTHER): Payer: BLUE CROSS/BLUE SHIELD

## 2017-05-21 DIAGNOSIS — J309 Allergic rhinitis, unspecified: Secondary | ICD-10-CM

## 2017-05-29 DIAGNOSIS — H43811 Vitreous degeneration, right eye: Secondary | ICD-10-CM | POA: Diagnosis not present

## 2017-05-29 DIAGNOSIS — G43B1 Ophthalmoplegic migraine, intractable: Secondary | ICD-10-CM | POA: Diagnosis not present

## 2017-06-18 ENCOUNTER — Ambulatory Visit (INDEPENDENT_AMBULATORY_CARE_PROVIDER_SITE_OTHER): Payer: BLUE CROSS/BLUE SHIELD | Admitting: *Deleted

## 2017-06-18 DIAGNOSIS — J309 Allergic rhinitis, unspecified: Secondary | ICD-10-CM

## 2017-07-01 DIAGNOSIS — J3089 Other allergic rhinitis: Secondary | ICD-10-CM | POA: Diagnosis not present

## 2017-07-16 ENCOUNTER — Ambulatory Visit (INDEPENDENT_AMBULATORY_CARE_PROVIDER_SITE_OTHER): Payer: BLUE CROSS/BLUE SHIELD | Admitting: *Deleted

## 2017-07-16 DIAGNOSIS — J309 Allergic rhinitis, unspecified: Secondary | ICD-10-CM

## 2017-07-28 DIAGNOSIS — Z1231 Encounter for screening mammogram for malignant neoplasm of breast: Secondary | ICD-10-CM | POA: Diagnosis not present

## 2017-07-28 DIAGNOSIS — Z01419 Encounter for gynecological examination (general) (routine) without abnormal findings: Secondary | ICD-10-CM | POA: Diagnosis not present

## 2017-07-28 DIAGNOSIS — Z1389 Encounter for screening for other disorder: Secondary | ICD-10-CM | POA: Diagnosis not present

## 2017-07-28 DIAGNOSIS — Z130 Encounter for screening for diseases of the blood and blood-forming organs and certain disorders involving the immune mechanism: Secondary | ICD-10-CM | POA: Diagnosis not present

## 2017-07-28 DIAGNOSIS — Z681 Body mass index (BMI) 19 or less, adult: Secondary | ICD-10-CM | POA: Diagnosis not present

## 2017-07-28 DIAGNOSIS — Z780 Asymptomatic menopausal state: Secondary | ICD-10-CM | POA: Diagnosis not present

## 2017-08-11 ENCOUNTER — Ambulatory Visit (INDEPENDENT_AMBULATORY_CARE_PROVIDER_SITE_OTHER): Payer: BLUE CROSS/BLUE SHIELD | Admitting: *Deleted

## 2017-08-11 DIAGNOSIS — J309 Allergic rhinitis, unspecified: Secondary | ICD-10-CM

## 2017-09-15 ENCOUNTER — Ambulatory Visit (INDEPENDENT_AMBULATORY_CARE_PROVIDER_SITE_OTHER): Payer: BLUE CROSS/BLUE SHIELD | Admitting: *Deleted

## 2017-09-15 DIAGNOSIS — J309 Allergic rhinitis, unspecified: Secondary | ICD-10-CM | POA: Diagnosis not present

## 2017-09-29 ENCOUNTER — Ambulatory Visit (INDEPENDENT_AMBULATORY_CARE_PROVIDER_SITE_OTHER): Payer: BLUE CROSS/BLUE SHIELD | Admitting: *Deleted

## 2017-09-29 DIAGNOSIS — J309 Allergic rhinitis, unspecified: Secondary | ICD-10-CM | POA: Diagnosis not present

## 2017-10-06 ENCOUNTER — Ambulatory Visit (INDEPENDENT_AMBULATORY_CARE_PROVIDER_SITE_OTHER): Payer: BLUE CROSS/BLUE SHIELD | Admitting: *Deleted

## 2017-10-06 DIAGNOSIS — J309 Allergic rhinitis, unspecified: Secondary | ICD-10-CM

## 2017-10-14 ENCOUNTER — Ambulatory Visit (INDEPENDENT_AMBULATORY_CARE_PROVIDER_SITE_OTHER): Payer: BLUE CROSS/BLUE SHIELD | Admitting: *Deleted

## 2017-10-14 DIAGNOSIS — J309 Allergic rhinitis, unspecified: Secondary | ICD-10-CM | POA: Diagnosis not present

## 2017-10-21 ENCOUNTER — Ambulatory Visit (INDEPENDENT_AMBULATORY_CARE_PROVIDER_SITE_OTHER): Payer: BLUE CROSS/BLUE SHIELD

## 2017-10-21 DIAGNOSIS — J309 Allergic rhinitis, unspecified: Secondary | ICD-10-CM | POA: Diagnosis not present

## 2017-11-18 ENCOUNTER — Ambulatory Visit (INDEPENDENT_AMBULATORY_CARE_PROVIDER_SITE_OTHER): Payer: BLUE CROSS/BLUE SHIELD | Admitting: Family Medicine

## 2017-11-18 DIAGNOSIS — J309 Allergic rhinitis, unspecified: Secondary | ICD-10-CM | POA: Diagnosis not present

## 2017-11-19 ENCOUNTER — Encounter: Payer: Self-pay | Admitting: *Deleted

## 2017-12-16 ENCOUNTER — Ambulatory Visit (INDEPENDENT_AMBULATORY_CARE_PROVIDER_SITE_OTHER): Payer: BLUE CROSS/BLUE SHIELD

## 2017-12-16 DIAGNOSIS — J309 Allergic rhinitis, unspecified: Secondary | ICD-10-CM | POA: Diagnosis not present

## 2018-01-05 DIAGNOSIS — J3089 Other allergic rhinitis: Secondary | ICD-10-CM

## 2018-01-06 DIAGNOSIS — Z0000 Encounter for general adult medical examination without abnormal findings: Secondary | ICD-10-CM | POA: Diagnosis not present

## 2018-01-06 DIAGNOSIS — Z136 Encounter for screening for cardiovascular disorders: Secondary | ICD-10-CM | POA: Diagnosis not present

## 2018-01-06 DIAGNOSIS — Z23 Encounter for immunization: Secondary | ICD-10-CM | POA: Diagnosis not present

## 2018-01-06 DIAGNOSIS — Z131 Encounter for screening for diabetes mellitus: Secondary | ICD-10-CM | POA: Diagnosis not present

## 2018-01-07 NOTE — Progress Notes (Signed)
VIALS EXP 01-08-19 

## 2018-01-13 ENCOUNTER — Ambulatory Visit (INDEPENDENT_AMBULATORY_CARE_PROVIDER_SITE_OTHER): Payer: BLUE CROSS/BLUE SHIELD | Admitting: *Deleted

## 2018-01-13 DIAGNOSIS — J309 Allergic rhinitis, unspecified: Secondary | ICD-10-CM | POA: Diagnosis not present

## 2018-01-25 DIAGNOSIS — H524 Presbyopia: Secondary | ICD-10-CM | POA: Diagnosis not present

## 2018-02-10 ENCOUNTER — Ambulatory Visit (INDEPENDENT_AMBULATORY_CARE_PROVIDER_SITE_OTHER): Payer: BLUE CROSS/BLUE SHIELD | Admitting: *Deleted

## 2018-02-10 DIAGNOSIS — J309 Allergic rhinitis, unspecified: Secondary | ICD-10-CM

## 2018-03-10 ENCOUNTER — Ambulatory Visit (INDEPENDENT_AMBULATORY_CARE_PROVIDER_SITE_OTHER): Payer: BLUE CROSS/BLUE SHIELD | Admitting: *Deleted

## 2018-03-10 DIAGNOSIS — J309 Allergic rhinitis, unspecified: Secondary | ICD-10-CM

## 2018-04-07 ENCOUNTER — Ambulatory Visit (INDEPENDENT_AMBULATORY_CARE_PROVIDER_SITE_OTHER): Payer: BLUE CROSS/BLUE SHIELD | Admitting: *Deleted

## 2018-04-07 DIAGNOSIS — J309 Allergic rhinitis, unspecified: Secondary | ICD-10-CM

## 2018-04-14 ENCOUNTER — Ambulatory Visit (INDEPENDENT_AMBULATORY_CARE_PROVIDER_SITE_OTHER): Payer: BLUE CROSS/BLUE SHIELD | Admitting: *Deleted

## 2018-04-14 DIAGNOSIS — J309 Allergic rhinitis, unspecified: Secondary | ICD-10-CM

## 2018-04-21 ENCOUNTER — Ambulatory Visit (INDEPENDENT_AMBULATORY_CARE_PROVIDER_SITE_OTHER): Payer: BLUE CROSS/BLUE SHIELD | Admitting: *Deleted

## 2018-04-21 DIAGNOSIS — J309 Allergic rhinitis, unspecified: Secondary | ICD-10-CM

## 2018-04-28 ENCOUNTER — Ambulatory Visit (INDEPENDENT_AMBULATORY_CARE_PROVIDER_SITE_OTHER): Payer: BLUE CROSS/BLUE SHIELD

## 2018-04-28 DIAGNOSIS — J309 Allergic rhinitis, unspecified: Secondary | ICD-10-CM

## 2018-05-04 ENCOUNTER — Ambulatory Visit (INDEPENDENT_AMBULATORY_CARE_PROVIDER_SITE_OTHER): Payer: BLUE CROSS/BLUE SHIELD | Admitting: *Deleted

## 2018-05-04 DIAGNOSIS — J309 Allergic rhinitis, unspecified: Secondary | ICD-10-CM | POA: Diagnosis not present

## 2018-06-02 ENCOUNTER — Ambulatory Visit (INDEPENDENT_AMBULATORY_CARE_PROVIDER_SITE_OTHER): Payer: BLUE CROSS/BLUE SHIELD

## 2018-06-02 DIAGNOSIS — J309 Allergic rhinitis, unspecified: Secondary | ICD-10-CM | POA: Diagnosis not present

## 2018-06-30 ENCOUNTER — Ambulatory Visit (INDEPENDENT_AMBULATORY_CARE_PROVIDER_SITE_OTHER): Payer: BLUE CROSS/BLUE SHIELD | Admitting: *Deleted

## 2018-06-30 DIAGNOSIS — J309 Allergic rhinitis, unspecified: Secondary | ICD-10-CM | POA: Diagnosis not present

## 2018-07-05 ENCOUNTER — Encounter: Payer: Self-pay | Admitting: Allergy

## 2018-07-05 ENCOUNTER — Ambulatory Visit: Payer: BLUE CROSS/BLUE SHIELD | Admitting: Allergy

## 2018-07-05 VITALS — BP 110/70 | HR 67 | Temp 98.2°F | Resp 16 | Ht 71.0 in | Wt 136.0 lb

## 2018-07-05 DIAGNOSIS — J4520 Mild intermittent asthma, uncomplicated: Secondary | ICD-10-CM | POA: Diagnosis not present

## 2018-07-05 DIAGNOSIS — H1010 Acute atopic conjunctivitis, unspecified eye: Secondary | ICD-10-CM | POA: Diagnosis not present

## 2018-07-05 DIAGNOSIS — J309 Allergic rhinitis, unspecified: Secondary | ICD-10-CM

## 2018-07-05 MED ORDER — ALBUTEROL SULFATE HFA 108 (90 BASE) MCG/ACT IN AERS: 2.0000 | INHALATION_SPRAY | Freq: Four times a day (QID) | RESPIRATORY_TRACT | 2 refills | Status: AC | PRN

## 2018-07-05 MED ORDER — EPINEPHRINE 0.3 MG/0.3ML IJ SOAJ: 0.3000 mg | Freq: Once | INTRAMUSCULAR | 2 refills | Status: AC

## 2018-07-05 NOTE — Progress Notes (Signed)
Follow-up Note  RE: Renee Hansen MRN: [id number] DOB: 09-13-1961 Date of Office Visit: 07/05/2018   History of present illness: Renee Hansen is a 57 y.o. female presenting today for follow-up of allergic rhinoconjunctivitis and asthma.  She was last seen in the office on September 06, 2015 by Dr. Willa Rough who is no longer in the practice. She is on allergen immunotherapy and states she has been on immunotherapy for the past 20-25 years.  She states she has never had any interruptions with her immunotherapy.  She states she is nervous to discontinue as she feels that she may have developed new allergies.  She also states that if she is around fresh cut grass or around any cats or dogs that she still does have symptoms of itchy watery eyes, sneezing, nasal congestion and drainage and sometimes wheezing with pet exposure. She states she has been needing to use her Allegra more often over the past half a year or so.  She is now needing Allegra couple of times a month versus in the past she was not needing to use it at all.  She did take Allegra today.  She states she also has noted more watery eye symptoms. She is tolerating injections well without large local or systemic reactions.  Her epinephrine device is expired. With her asthma she states she only is symptomatic around pets primarily.  She will develop wheezing.  She does not remember the last time she is needed to use her inhaler but does think she has been at least a year or more without use.  She denies any nighttime awakenings.  No ED or urgent care visits or any hospitalizations for asthma related symptoms.  She denies any need for oral steroids.  Since her last visit she has not had any major health changes, surgeries or hospitalizations.  Review of systems: Review of Systems  Constitutional: Negative for chills, fever and malaise/fatigue.  HENT: Positive for congestion. Negative for ear discharge, ear pain, nosebleeds and sore  throat.   Eyes: Negative for pain, discharge and redness.  Respiratory: Negative for cough, shortness of breath and wheezing.   Cardiovascular: Negative for chest pain.  Gastrointestinal: Negative for abdominal pain, constipation, diarrhea, heartburn, nausea and vomiting.  Musculoskeletal: Negative for joint pain.  Skin: Negative for itching and rash.  Neurological: Negative for headaches.    All other systems negative unless noted above in HPI  Past medical/social/surgical/family history have been reviewed and are unchanged unless specifically indicated below.  No changes  Medication List: Allergies as of 07/05/2018      Reactions   Chlorthalidone Other (See Comments)   Potassium drop, hospitalized       Medication List        Accurate as of 07/05/18  5:11 PM. Always use your most recent med list.          Biotin 1000 MCG Chew biotin   cholecalciferol 1000 units tablet Commonly known as:  VITAMIN D Take 1,000 Units by mouth daily.   fish oil-omega-3 fatty acids 1000 MG capsule Take 1 g by mouth daily.   multivitamin with minerals Tabs tablet Take 1 tablet by mouth daily.   UNABLE TO FIND Allergy Injections   valACYclovir 500 MG tablet Commonly known as:  VALTREX TAKE 1 TABLET BY MOUTH TWICE DAILY FOR 5 DAYS AS NEEDED FOR OUTBREAK   vitamin C 500 MG tablet Commonly known as:  ASCORBIC ACID Take 500 mg by mouth daily.   vitamin  E 400 UNIT capsule Take 400 Units by mouth daily.       Known medication allergies: Allergies  Allergen Reactions  . Chlorthalidone Other (See Comments)    Potassium drop, hospitalized      Physical examination: Blood pressure 110/70, pulse 67, temperature 98.2 F (36.8 C), temperature source Oral, resp. rate 16, height 5\' 11"  (1.803 m), weight 136 lb (61.7 kg), SpO2 99 %.  General: Alert, interactive, in no acute distress. HEENT: PERRLA, TMs pearly gray, turbinates mildly edematous without discharge, post-pharynx non  erythematous. Neck: Supple without lymphadenopathy. Lungs: Clear to auscultation without wheezing, rhonchi or rales. {no increased work of breathing. CV: Normal S1, S2 without murmurs. Abdomen: Nondistended, nontender. Skin: Warm and dry, without lesions or rashes. Extremities:  No clubbing, cyanosis or edema. Neuro:   Grossly intact.  Diagnositics/Labs:  Spirometry: FEV1: 2.33L 70%, FVC: 3.76L 88%, ratio consistent with Mild obstructive pattern  Assessment and plan:   Allergic rhinoconjunctivitis    - continue allergen immunotherapy per protocol and have access to epinephrine device Audry Riles(AuviQ)    - continue allegra 180mg  daily as needed    - will plan to repeat skin testing in next upcoming weeks.  At which time we will determine if her immunotherapy prescription needs to be altered.  Given continued symptoms with at least grass and pet exposure she likely needs to continue on immunotherapy at this time +/-change in immunotherapy prescription  Allergic asthma   - have access to albuterol inhaler 2 puffs every 4-6 hours as needed for cough/wheeze/shortness of breath/chest tightness.  May use 15-20 minutes prior to activity.   Monitor frequency of use.     Follow-up for skin testing visit   I appreciate the opportunity to take part in [name]'s care. Please do not hesitate to contact me with questions.  Sincerely,   Margo AyeShaylar Jonnathan Birman, MD Allergy/Immunology [facility]

## 2018-07-05 NOTE — Patient Instructions (Signed)
Allergic rhinoconjunctivitis    - continue allergen immunotherapy per protocol and have access to epinephrine device Audry Riles(AuviQ)    - continue allegra 180mg  daily as needed    - will plan to repeat skin testing in next up coming weeks.  Schedule a skin testing appointment and hold your Allegra for 3 days prior to testing visit  Allergic asthma   - have access to albuterol inhaler 2 puffs every 4-6 hours as needed for cough/wheeze/shortness of breath/chest tightness.  May use 15-20 minutes prior to activity.   Monitor frequency of use.     Follow-up for skin testing visit

## 2018-07-28 ENCOUNTER — Ambulatory Visit (INDEPENDENT_AMBULATORY_CARE_PROVIDER_SITE_OTHER): Payer: BLUE CROSS/BLUE SHIELD

## 2018-07-28 DIAGNOSIS — J309 Allergic rhinitis, unspecified: Secondary | ICD-10-CM | POA: Diagnosis not present

## 2018-08-11 ENCOUNTER — Ambulatory Visit: Payer: BLUE CROSS/BLUE SHIELD | Admitting: Allergy

## 2018-08-11 VITALS — BP 100/60 | HR 79 | Resp 16 | Ht 71.0 in | Wt 136.0 lb

## 2018-08-11 DIAGNOSIS — J309 Allergic rhinitis, unspecified: Secondary | ICD-10-CM

## 2018-08-11 DIAGNOSIS — H1010 Acute atopic conjunctivitis, unspecified eye: Secondary | ICD-10-CM | POA: Diagnosis not present

## 2018-08-11 DIAGNOSIS — T781XXA Other adverse food reactions, not elsewhere classified, initial encounter: Secondary | ICD-10-CM

## 2018-08-11 DIAGNOSIS — J4520 Mild intermittent asthma, uncomplicated: Secondary | ICD-10-CM

## 2018-08-11 NOTE — Progress Notes (Signed)
Follow-up Note  RE: Renee Hansen MRN: [id number] DOB: 24-Mar-1961 Date of Office Visit: 08/11/2018   History of present illness: Renee Hansen is a 57 y.o. female presenting today for skin testing visit.  She was last seen in the office on 07/05/18 by myself. At the last visit we discussed her current allergy symptoms around fresh cut grass or around any cats or dogs that she still does have symptoms of itchy watery eyes, sneezing, nasal congestion and drainage and sometimes wheezing with pet exposure.  She has been needing to use her Allegra more often over the past half a year or so.  She is now needing Allegra couple of times a month versus in the past she was not needing to use it at all.  She has held her antihistamine for 3 days for testing today.   She also states that with almond ingestion she develops itching of her gums thus she avoids almonds.  She is able to eat other tree nuts without issues.     Review of systems: Review of Systems  Constitutional: Negative for chills, fever and malaise/fatigue.  HENT: Positive for congestion. Negative for ear discharge, ear pain, nosebleeds, sinus pain and sore throat.   Eyes: Negative for pain, discharge and redness.  Respiratory: Negative for cough, shortness of breath and wheezing.   Cardiovascular: Negative for chest pain.  Gastrointestinal: Negative for abdominal pain, constipation, diarrhea, nausea and vomiting.  Musculoskeletal: Negative for joint pain.  Skin: Negative for itching and rash.  Neurological: Negative for headaches.    All other systems negative unless noted above in HPI  Past medical/social/surgical/family history have been reviewed and are unchanged unless specifically indicated below.  No changes  Medication List: Allergies as of 08/11/2018      Reactions   Chlorthalidone Other (See Comments)   Potassium drop, hospitalized       Medication List        Accurate as of 08/11/18  5:08 PM. Always use your  most recent med list.          albuterol 108 (90 Base) MCG/ACT inhaler Commonly known as:  PROVENTIL HFA;VENTOLIN HFA Inhale 2 puffs into the lungs every 6 (six) hours as needed for wheezing or shortness of breath.   Biotin 1000 MCG Chew biotin   cholecalciferol 1000 units tablet Commonly known as:  VITAMIN D Take 1,000 Units by mouth daily.   fish oil-omega-3 fatty acids 1000 MG capsule Take 1 g by mouth daily.   multivitamin with minerals Tabs tablet Take 1 tablet by mouth daily.   UNABLE TO FIND Allergy Injections   valACYclovir 500 MG tablet Commonly known as:  VALTREX TAKE 1 TABLET BY MOUTH TWICE DAILY FOR 5 DAYS AS NEEDED FOR OUTBREAK   vitamin C 500 MG tablet Commonly known as:  ASCORBIC ACID Take 500 mg by mouth daily.   vitamin E 400 UNIT capsule Take 400 Units by mouth daily.       Known medication allergies: Allergies  Allergen Reactions  . Chlorthalidone Other (See Comments)    Potassium drop, hospitalized      Physical examination: Blood pressure 100/60, pulse 79, resp. rate 16, height 5\' 11"  (1.803 m), weight 136 lb (61.7 kg), SpO2 98 %.  General: Alert, interactive, in no acute distress. HEENT: PERRLA, TMs pearly gray, turbinates minimally edematous without discharge, post-pharynx mildly erythematous. Neck: Supple without lymphadenopathy. Lungs: Clear to auscultation without wheezing, rhonchi or rales. {no increased work of breathing. CV: Normal  S1, S2 without murmurs. Abdomen: Nondistended, nontender. Skin: Warm and dry, without lesions or rashes. Extremities:  No clubbing, cyanosis or edema. Neuro:   Grossly intact.  Diagnositics/Labs:  Allergy testing: environmental panel skin prick testing is positive to grasses, weeds, trees, molds, dust mites, cat, cockroach.   Almond skin prick testing is negative.   Allergy testing results were read and interpreted by provider, documented by clinical staff.   Assessment and plan:   Allergic  rhinoconjunctivitis    - environmental allergy skin testing today reveals continued sensitivity to grass pollens, weeds, trees, molds, dust mites, cat, cockroach.  She does have new sensitivities that are not included in her current immunotherapy vials: bahia, english plantain, rough pigweed, beech, walnut.  She has also lost sensitivity to alternaria, fusarium, pullulara, dog.      - discussed options to continue immunotherapy.  We came to conclusion to remake vials with current sensitivities and start from beginning.   She however will continue her current vials until gone.      - have access to epinephrine device Audry Riles)    - continue allegra 180mg  daily as needed  Allergic asthma   - have access to albuterol inhaler 2 puffs every 4-6 hours as needed for cough/wheeze/shortness of breath/chest tightness.  May use 15-20 minutes prior to activity.   Monitor frequency of use.    Pollen food allergy syndrome   - The oral allergy syndrome (OAS) or pollen-food allergy syndrome (PFAS) is a relatively common form of food allergy, particularly in adults. It typically occurs in people who have pollen allergies when the immune system "sees" proteins on the food that look like proteins on the pollen. This results in the allergy antibody (IgE) binding to the food instead of the pollen. Patients typically report itching and/or mild swelling of the mouth and throat immediately following ingestion of certain uncooked fruits (including nuts) or raw vegetables. Only a very small number of affected individuals experience systemic allergic reactions, such as anaphylaxis which occurs with true food allergies.     Follow-up for 6-9 months or sooner if needed  I appreciate the opportunity to take part in Renee Hansen's care. Please do not hesitate to contact me with questions.  Sincerely,   Margo Aye, MD Allergy/Immunology [facility]

## 2018-08-11 NOTE — Patient Instructions (Addendum)
Allergic rhinoconjunctivitis    - environmental allergy skin testing today reveals continued sensitivity to grass pollens, weeds, trees, molds, dust mites, cat, cockroach.  She does have new sensitivities that are not included in her current immunotherapy vials: bahia, english plantain, rough pigweed, beech, walnut.  She has also lost sensitivity to alternaria, fusarium, pullulara, dog.      - discussed options to continue immunotherapy.  We came to conclusion to remake vials with current sensitivities and start from beginning.   She however will continue her current vials until gone.      - have access to epinephrine device Audry Riles)    - continue allegra 180mg  daily as needed  Allergic asthma   - have access to albuterol inhaler 2 puffs every 4-6 hours as needed for cough/wheeze/shortness of breath/chest tightness.  May use 15-20 minutes prior to activity.   Monitor frequency of use.    Pollen food allergy syndrome   - The oral allergy syndrome (OAS) or pollen-food allergy syndrome (PFAS) is a relatively common form of food allergy, particularly in adults. It typically occurs in people who have pollen allergies when the immune system "sees" proteins on the food that look like proteins on the pollen. This results in the allergy antibody (IgE) binding to the food instead of the pollen. Patients typically report itching and/or mild swelling of the mouth and throat immediately following ingestion of certain uncooked fruits (including nuts) or raw vegetables. Only a very small number of affected individuals experience systemic allergic reactions, such as anaphylaxis which occurs with true food allergies.     Follow-up for 6-9 months or sooner if needed

## 2018-08-25 ENCOUNTER — Ambulatory Visit (INDEPENDENT_AMBULATORY_CARE_PROVIDER_SITE_OTHER): Payer: BLUE CROSS/BLUE SHIELD

## 2018-08-25 DIAGNOSIS — J309 Allergic rhinitis, unspecified: Secondary | ICD-10-CM

## 2018-08-26 NOTE — Progress Notes (Signed)
Vials exp 08-27-19 

## 2018-08-27 DIAGNOSIS — J3089 Other allergic rhinitis: Secondary | ICD-10-CM

## 2018-09-22 ENCOUNTER — Ambulatory Visit (INDEPENDENT_AMBULATORY_CARE_PROVIDER_SITE_OTHER): Payer: BLUE CROSS/BLUE SHIELD

## 2018-09-22 DIAGNOSIS — J309 Allergic rhinitis, unspecified: Secondary | ICD-10-CM

## 2018-10-20 ENCOUNTER — Ambulatory Visit: Payer: Self-pay | Admitting: *Deleted

## 2018-10-20 ENCOUNTER — Telehealth: Payer: Self-pay | Admitting: *Deleted

## 2018-10-20 NOTE — Telephone Encounter (Signed)
Patient is aware that she will have to restart and she is fine with that plan.

## 2018-10-20 NOTE — Telephone Encounter (Signed)
Left message for patient to call office. Need to confirm that patient is aware that if she start new prescription for her allergy injections that will mean she is starting over at the beginning. Please confirm with patient and let Dr. Delorse LekPadgett know.

## 2018-10-22 DIAGNOSIS — J301 Allergic rhinitis due to pollen: Secondary | ICD-10-CM | POA: Diagnosis not present

## 2018-10-22 NOTE — Progress Notes (Addendum)
REPRINT LABELS 01-28-2020.  REMIXED VIALS PER DR Delorse LekPadgett Exp 10-23-19.  REPRINT LABELS.

## 2018-10-22 NOTE — Addendum Note (Signed)
Addended by: Mariane DuvalVERNON, Brailee Riede L on: 10/22/2018 09:31 AM   Modules accepted: Orders

## 2018-10-22 NOTE — Addendum Note (Signed)
Addended by: Lorrin MaisPADGETT, Tymara Saur P on: 10/22/2018 09:09 AM   Modules accepted: Orders

## 2018-10-25 DIAGNOSIS — J3089 Other allergic rhinitis: Secondary | ICD-10-CM

## 2018-11-02 ENCOUNTER — Ambulatory Visit (INDEPENDENT_AMBULATORY_CARE_PROVIDER_SITE_OTHER): Payer: BLUE CROSS/BLUE SHIELD | Admitting: *Deleted

## 2018-11-02 DIAGNOSIS — J309 Allergic rhinitis, unspecified: Secondary | ICD-10-CM | POA: Diagnosis not present

## 2018-11-02 NOTE — Progress Notes (Signed)
Immunotherapy   Patient Details  Name: Renee LauraLorraine L Blount MRN: [id number] Date of Birth: 08/08/1961  11/02/2018  Renee Hansen started injections for  Pollen-Pets & Mold-Dmite-Cockraoch-Ragweed. Following schedule: B  Frequency:2 times per week Epi-Pen:Epi-Pen Available  Consent signed and patient instructions given. Patient vials were remixed to reflect most recent testing. No problems after 30 minutes in the office.   Mariane DuvalHeather L Godric Lavell 11/02/2018, 3:53 PM

## 2018-11-04 ENCOUNTER — Ambulatory Visit (INDEPENDENT_AMBULATORY_CARE_PROVIDER_SITE_OTHER): Payer: BLUE CROSS/BLUE SHIELD

## 2018-11-04 DIAGNOSIS — J309 Allergic rhinitis, unspecified: Secondary | ICD-10-CM | POA: Diagnosis not present

## 2018-11-08 ENCOUNTER — Ambulatory Visit (INDEPENDENT_AMBULATORY_CARE_PROVIDER_SITE_OTHER): Payer: BLUE CROSS/BLUE SHIELD

## 2018-11-08 DIAGNOSIS — J309 Allergic rhinitis, unspecified: Secondary | ICD-10-CM

## 2018-11-10 ENCOUNTER — Ambulatory Visit (INDEPENDENT_AMBULATORY_CARE_PROVIDER_SITE_OTHER): Payer: BLUE CROSS/BLUE SHIELD | Admitting: *Deleted

## 2018-11-10 DIAGNOSIS — J309 Allergic rhinitis, unspecified: Secondary | ICD-10-CM | POA: Diagnosis not present

## 2018-11-15 ENCOUNTER — Ambulatory Visit (INDEPENDENT_AMBULATORY_CARE_PROVIDER_SITE_OTHER): Payer: BLUE CROSS/BLUE SHIELD | Admitting: *Deleted

## 2018-11-15 DIAGNOSIS — J309 Allergic rhinitis, unspecified: Secondary | ICD-10-CM | POA: Diagnosis not present

## 2018-11-17 ENCOUNTER — Ambulatory Visit (INDEPENDENT_AMBULATORY_CARE_PROVIDER_SITE_OTHER): Payer: BLUE CROSS/BLUE SHIELD | Admitting: *Deleted

## 2018-11-17 DIAGNOSIS — J309 Allergic rhinitis, unspecified: Secondary | ICD-10-CM | POA: Diagnosis not present

## 2018-11-22 ENCOUNTER — Ambulatory Visit (INDEPENDENT_AMBULATORY_CARE_PROVIDER_SITE_OTHER): Payer: BLUE CROSS/BLUE SHIELD | Admitting: *Deleted

## 2018-11-22 DIAGNOSIS — J309 Allergic rhinitis, unspecified: Secondary | ICD-10-CM

## 2018-11-26 ENCOUNTER — Ambulatory Visit (INDEPENDENT_AMBULATORY_CARE_PROVIDER_SITE_OTHER): Payer: BLUE CROSS/BLUE SHIELD | Admitting: *Deleted

## 2018-11-26 DIAGNOSIS — J309 Allergic rhinitis, unspecified: Secondary | ICD-10-CM

## 2018-11-29 ENCOUNTER — Ambulatory Visit (INDEPENDENT_AMBULATORY_CARE_PROVIDER_SITE_OTHER): Payer: BLUE CROSS/BLUE SHIELD | Admitting: *Deleted

## 2018-11-29 DIAGNOSIS — J309 Allergic rhinitis, unspecified: Secondary | ICD-10-CM

## 2018-12-02 ENCOUNTER — Ambulatory Visit (INDEPENDENT_AMBULATORY_CARE_PROVIDER_SITE_OTHER): Payer: BLUE CROSS/BLUE SHIELD

## 2018-12-02 DIAGNOSIS — J309 Allergic rhinitis, unspecified: Secondary | ICD-10-CM | POA: Diagnosis not present

## 2018-12-06 ENCOUNTER — Ambulatory Visit (INDEPENDENT_AMBULATORY_CARE_PROVIDER_SITE_OTHER): Payer: BLUE CROSS/BLUE SHIELD

## 2018-12-06 DIAGNOSIS — J309 Allergic rhinitis, unspecified: Secondary | ICD-10-CM | POA: Diagnosis not present

## 2018-12-08 ENCOUNTER — Ambulatory Visit (INDEPENDENT_AMBULATORY_CARE_PROVIDER_SITE_OTHER): Payer: BLUE CROSS/BLUE SHIELD | Admitting: *Deleted

## 2018-12-08 DIAGNOSIS — J309 Allergic rhinitis, unspecified: Secondary | ICD-10-CM | POA: Diagnosis not present

## 2018-12-13 ENCOUNTER — Ambulatory Visit (INDEPENDENT_AMBULATORY_CARE_PROVIDER_SITE_OTHER): Payer: BLUE CROSS/BLUE SHIELD

## 2018-12-13 DIAGNOSIS — J309 Allergic rhinitis, unspecified: Secondary | ICD-10-CM

## 2018-12-15 ENCOUNTER — Ambulatory Visit (INDEPENDENT_AMBULATORY_CARE_PROVIDER_SITE_OTHER): Payer: BLUE CROSS/BLUE SHIELD | Admitting: *Deleted

## 2018-12-15 DIAGNOSIS — J309 Allergic rhinitis, unspecified: Secondary | ICD-10-CM | POA: Diagnosis not present

## 2018-12-21 ENCOUNTER — Ambulatory Visit (INDEPENDENT_AMBULATORY_CARE_PROVIDER_SITE_OTHER): Payer: BLUE CROSS/BLUE SHIELD | Admitting: *Deleted

## 2018-12-21 DIAGNOSIS — J309 Allergic rhinitis, unspecified: Secondary | ICD-10-CM | POA: Diagnosis not present

## 2018-12-22 DIAGNOSIS — Z681 Body mass index (BMI) 19 or less, adult: Secondary | ICD-10-CM | POA: Diagnosis not present

## 2018-12-22 DIAGNOSIS — Z130 Encounter for screening for diseases of the blood and blood-forming organs and certain disorders involving the immune mechanism: Secondary | ICD-10-CM | POA: Diagnosis not present

## 2018-12-22 DIAGNOSIS — Z1231 Encounter for screening mammogram for malignant neoplasm of breast: Secondary | ICD-10-CM | POA: Diagnosis not present

## 2018-12-22 DIAGNOSIS — Z01419 Encounter for gynecological examination (general) (routine) without abnormal findings: Secondary | ICD-10-CM | POA: Diagnosis not present

## 2018-12-22 DIAGNOSIS — Z780 Asymptomatic menopausal state: Secondary | ICD-10-CM | POA: Diagnosis not present

## 2018-12-23 ENCOUNTER — Ambulatory Visit (INDEPENDENT_AMBULATORY_CARE_PROVIDER_SITE_OTHER): Payer: BLUE CROSS/BLUE SHIELD | Admitting: *Deleted

## 2018-12-23 DIAGNOSIS — J309 Allergic rhinitis, unspecified: Secondary | ICD-10-CM

## 2018-12-27 ENCOUNTER — Ambulatory Visit (INDEPENDENT_AMBULATORY_CARE_PROVIDER_SITE_OTHER): Payer: BLUE CROSS/BLUE SHIELD

## 2018-12-27 DIAGNOSIS — J309 Allergic rhinitis, unspecified: Secondary | ICD-10-CM | POA: Diagnosis not present

## 2018-12-29 ENCOUNTER — Ambulatory Visit (INDEPENDENT_AMBULATORY_CARE_PROVIDER_SITE_OTHER): Payer: BLUE CROSS/BLUE SHIELD | Admitting: *Deleted

## 2018-12-29 DIAGNOSIS — J309 Allergic rhinitis, unspecified: Secondary | ICD-10-CM

## 2019-01-03 ENCOUNTER — Ambulatory Visit (INDEPENDENT_AMBULATORY_CARE_PROVIDER_SITE_OTHER): Payer: BLUE CROSS/BLUE SHIELD | Admitting: *Deleted

## 2019-01-03 DIAGNOSIS — J309 Allergic rhinitis, unspecified: Secondary | ICD-10-CM | POA: Diagnosis not present

## 2019-01-10 ENCOUNTER — Ambulatory Visit (INDEPENDENT_AMBULATORY_CARE_PROVIDER_SITE_OTHER): Payer: BLUE CROSS/BLUE SHIELD | Admitting: *Deleted

## 2019-01-10 ENCOUNTER — Telehealth: Payer: Self-pay | Admitting: *Deleted

## 2019-01-10 DIAGNOSIS — J309 Allergic rhinitis, unspecified: Secondary | ICD-10-CM

## 2019-01-10 NOTE — Telephone Encounter (Signed)
Patient has completed her build up phase for her new vials. Once she reaches 0.50 of her red can she go back to every 4 weeks? Please advise.

## 2019-01-11 NOTE — Telephone Encounter (Signed)
absolutely

## 2019-01-12 NOTE — Telephone Encounter (Signed)
Noted on flow sheet.

## 2019-01-19 ENCOUNTER — Ambulatory Visit (INDEPENDENT_AMBULATORY_CARE_PROVIDER_SITE_OTHER): Payer: BLUE CROSS/BLUE SHIELD | Admitting: *Deleted

## 2019-01-19 DIAGNOSIS — J309 Allergic rhinitis, unspecified: Secondary | ICD-10-CM

## 2019-01-26 ENCOUNTER — Ambulatory Visit (INDEPENDENT_AMBULATORY_CARE_PROVIDER_SITE_OTHER): Payer: BLUE CROSS/BLUE SHIELD

## 2019-01-26 DIAGNOSIS — J309 Allergic rhinitis, unspecified: Secondary | ICD-10-CM

## 2019-02-01 ENCOUNTER — Ambulatory Visit (INDEPENDENT_AMBULATORY_CARE_PROVIDER_SITE_OTHER): Payer: BLUE CROSS/BLUE SHIELD | Admitting: *Deleted

## 2019-02-01 DIAGNOSIS — J309 Allergic rhinitis, unspecified: Secondary | ICD-10-CM | POA: Diagnosis not present

## 2019-02-07 ENCOUNTER — Ambulatory Visit (INDEPENDENT_AMBULATORY_CARE_PROVIDER_SITE_OTHER): Payer: BLUE CROSS/BLUE SHIELD

## 2019-02-07 DIAGNOSIS — J309 Allergic rhinitis, unspecified: Secondary | ICD-10-CM | POA: Diagnosis not present

## 2019-03-07 ENCOUNTER — Ambulatory Visit (INDEPENDENT_AMBULATORY_CARE_PROVIDER_SITE_OTHER): Payer: BLUE CROSS/BLUE SHIELD

## 2019-03-07 DIAGNOSIS — J309 Allergic rhinitis, unspecified: Secondary | ICD-10-CM

## 2019-04-06 ENCOUNTER — Ambulatory Visit (INDEPENDENT_AMBULATORY_CARE_PROVIDER_SITE_OTHER): Payer: BLUE CROSS/BLUE SHIELD | Admitting: *Deleted

## 2019-04-06 DIAGNOSIS — J309 Allergic rhinitis, unspecified: Secondary | ICD-10-CM

## 2019-04-13 NOTE — Progress Notes (Signed)
VIALS EXP 04-12-2020 

## 2019-04-14 DIAGNOSIS — J3089 Other allergic rhinitis: Secondary | ICD-10-CM | POA: Diagnosis not present

## 2019-05-04 ENCOUNTER — Ambulatory Visit (INDEPENDENT_AMBULATORY_CARE_PROVIDER_SITE_OTHER): Payer: BLUE CROSS/BLUE SHIELD | Admitting: *Deleted

## 2019-05-04 DIAGNOSIS — J309 Allergic rhinitis, unspecified: Secondary | ICD-10-CM | POA: Diagnosis not present

## 2019-05-12 DIAGNOSIS — Z0000 Encounter for general adult medical examination without abnormal findings: Secondary | ICD-10-CM | POA: Diagnosis not present

## 2019-05-24 DIAGNOSIS — Z1159 Encounter for screening for other viral diseases: Secondary | ICD-10-CM | POA: Diagnosis not present

## 2019-05-24 DIAGNOSIS — Z8249 Family history of ischemic heart disease and other diseases of the circulatory system: Secondary | ICD-10-CM | POA: Diagnosis not present

## 2019-05-24 DIAGNOSIS — Z0000 Encounter for general adult medical examination without abnormal findings: Secondary | ICD-10-CM | POA: Diagnosis not present

## 2019-05-24 DIAGNOSIS — Z13220 Encounter for screening for lipoid disorders: Secondary | ICD-10-CM | POA: Diagnosis not present

## 2019-06-06 ENCOUNTER — Ambulatory Visit (INDEPENDENT_AMBULATORY_CARE_PROVIDER_SITE_OTHER): Payer: BLUE CROSS/BLUE SHIELD

## 2019-06-06 DIAGNOSIS — J309 Allergic rhinitis, unspecified: Secondary | ICD-10-CM

## 2019-07-04 ENCOUNTER — Ambulatory Visit (INDEPENDENT_AMBULATORY_CARE_PROVIDER_SITE_OTHER): Payer: BLUE CROSS/BLUE SHIELD | Admitting: *Deleted

## 2019-07-04 DIAGNOSIS — J309 Allergic rhinitis, unspecified: Secondary | ICD-10-CM

## 2019-07-11 ENCOUNTER — Ambulatory Visit (INDEPENDENT_AMBULATORY_CARE_PROVIDER_SITE_OTHER): Payer: BLUE CROSS/BLUE SHIELD | Admitting: *Deleted

## 2019-07-11 DIAGNOSIS — J309 Allergic rhinitis, unspecified: Secondary | ICD-10-CM

## 2019-07-18 ENCOUNTER — Ambulatory Visit (INDEPENDENT_AMBULATORY_CARE_PROVIDER_SITE_OTHER): Payer: BLUE CROSS/BLUE SHIELD

## 2019-07-18 DIAGNOSIS — J309 Allergic rhinitis, unspecified: Secondary | ICD-10-CM | POA: Diagnosis not present

## 2019-07-25 ENCOUNTER — Ambulatory Visit (INDEPENDENT_AMBULATORY_CARE_PROVIDER_SITE_OTHER): Payer: BLUE CROSS/BLUE SHIELD | Admitting: *Deleted

## 2019-07-25 DIAGNOSIS — J309 Allergic rhinitis, unspecified: Secondary | ICD-10-CM | POA: Diagnosis not present

## 2019-08-01 ENCOUNTER — Ambulatory Visit (INDEPENDENT_AMBULATORY_CARE_PROVIDER_SITE_OTHER): Payer: BLUE CROSS/BLUE SHIELD

## 2019-08-01 DIAGNOSIS — J309 Allergic rhinitis, unspecified: Secondary | ICD-10-CM

## 2019-08-29 ENCOUNTER — Ambulatory Visit (INDEPENDENT_AMBULATORY_CARE_PROVIDER_SITE_OTHER): Payer: BLUE CROSS/BLUE SHIELD

## 2019-08-29 DIAGNOSIS — J309 Allergic rhinitis, unspecified: Secondary | ICD-10-CM | POA: Diagnosis not present

## 2019-09-26 ENCOUNTER — Ambulatory Visit (INDEPENDENT_AMBULATORY_CARE_PROVIDER_SITE_OTHER): Payer: BLUE CROSS/BLUE SHIELD | Admitting: *Deleted

## 2019-09-26 DIAGNOSIS — J309 Allergic rhinitis, unspecified: Secondary | ICD-10-CM

## 2019-10-24 ENCOUNTER — Ambulatory Visit (INDEPENDENT_AMBULATORY_CARE_PROVIDER_SITE_OTHER): Payer: BLUE CROSS/BLUE SHIELD

## 2019-10-24 DIAGNOSIS — J309 Allergic rhinitis, unspecified: Secondary | ICD-10-CM | POA: Diagnosis not present

## 2019-11-22 ENCOUNTER — Ambulatory Visit (INDEPENDENT_AMBULATORY_CARE_PROVIDER_SITE_OTHER): Payer: BC Managed Care – PPO

## 2019-11-22 DIAGNOSIS — J309 Allergic rhinitis, unspecified: Secondary | ICD-10-CM | POA: Diagnosis not present

## 2019-12-20 ENCOUNTER — Ambulatory Visit (INDEPENDENT_AMBULATORY_CARE_PROVIDER_SITE_OTHER): Payer: BC Managed Care – PPO

## 2019-12-20 DIAGNOSIS — J309 Allergic rhinitis, unspecified: Secondary | ICD-10-CM

## 2019-12-26 DIAGNOSIS — J301 Allergic rhinitis due to pollen: Secondary | ICD-10-CM | POA: Diagnosis not present

## 2019-12-26 NOTE — Progress Notes (Signed)
VIALS EXP 12-25-20 

## 2019-12-28 DIAGNOSIS — Z1231 Encounter for screening mammogram for malignant neoplasm of breast: Secondary | ICD-10-CM | POA: Diagnosis not present

## 2020-01-09 DIAGNOSIS — H524 Presbyopia: Secondary | ICD-10-CM | POA: Diagnosis not present

## 2020-01-20 ENCOUNTER — Encounter: Payer: Self-pay | Admitting: Allergy

## 2020-01-20 ENCOUNTER — Other Ambulatory Visit: Payer: Self-pay

## 2020-01-20 ENCOUNTER — Ambulatory Visit (INDEPENDENT_AMBULATORY_CARE_PROVIDER_SITE_OTHER): Payer: BC Managed Care – PPO | Admitting: Allergy

## 2020-01-20 VITALS — BP 122/70 | HR 78 | Temp 98.1°F | Resp 18 | Ht 70.5 in | Wt 135.0 lb

## 2020-01-20 DIAGNOSIS — J3089 Other allergic rhinitis: Secondary | ICD-10-CM

## 2020-01-20 DIAGNOSIS — J4520 Mild intermittent asthma, uncomplicated: Secondary | ICD-10-CM

## 2020-01-20 DIAGNOSIS — H1013 Acute atopic conjunctivitis, bilateral: Secondary | ICD-10-CM | POA: Diagnosis not present

## 2020-01-20 DIAGNOSIS — T781XXD Other adverse food reactions, not elsewhere classified, subsequent encounter: Secondary | ICD-10-CM | POA: Diagnosis not present

## 2020-01-20 NOTE — Patient Instructions (Addendum)
Allergic rhinoconjunctivitis    - continue avoidance measures for grass pollens, weeds, trees, molds, dust mites, cat, cockroach.     - continue immunotherapy per schedule     - have access to epinephrine device Audry Riles)    - continue allegra 180mg  daily as needed  Allergic asthma   - have access to albuterol inhaler 2 puffs every 4-6 hours as needed for cough/wheeze/shortness of breath/chest tightness.  May use 15-20 minutes prior to activity.   Monitor frequency of use.    Pollen food allergy syndrome   - The oral allergy syndrome (OAS) or pollen-food allergy syndrome (PFAS) is a relatively common form of food allergy, particularly in adults. It typically occurs in people who have pollen allergies when the immune system "sees" proteins on the food that look like proteins on the pollen. This results in the allergy antibody (IgE) binding to the food instead of the pollen. Patients typically report itching and/or mild swelling of the mouth and throat immediately following ingestion of certain uncooked fruits (including nuts) or raw vegetables. Only a very small number of affected individuals experience systemic allergic reactions, such as anaphylaxis which occurs with true food allergies.    - able to eat almond without issue now!   Follow-up for 12  months or sooner if needed

## 2020-01-20 NOTE — Progress Notes (Signed)
Follow-up Note  RE: Renee Hansen MRN: [id number] DOB: [birth year] Date of Office Visit: 01/20/2020   History of present illness: Renee Hansen is a 59 y.o. female presenting today for follow-up of allergic rhinitis with conjunctivitis, asthma and pollen food allergy syndrome.  She was last seen in the office on 08/11/2018 by myself.  At that visit she was still complaining of allergy symptoms and thus we decided to perform repeat skin testing.  She was found to have sensitivities to new allergens that were not contained in her current immunotherapy.  We made the decision to restart immunotherapy with what she was currently sensitive to.  She states since being on the new allergy of bowel that she is notice a big difference.  She states she is not having any allergy based symptoms.  She at this time is only using her Allegra on the days of her allergy injections.  She also states that she is able to eat almonds without any issues.  She states previously they would cause her to have oral cavity symptoms like mouth itch unlikely related to her pollen sensitivity.  She states with her allergic based asthma that she has not had any symptoms.  She has not used her albuterol in the past year and does not recall the last time she has needed to use her albuterol inhaler.   Review of systems: Review of Systems  Constitutional: Negative.   HENT: Negative.   Eyes: Negative.   Respiratory: Negative.   Cardiovascular: Negative.   Gastrointestinal: Negative.   Musculoskeletal: Negative.   Skin: Negative.   Neurological: Negative.     All other systems negative unless noted above in HPI  Past medical/social/surgical/family history have been reviewed and are unchanged unless specifically indicated below.  No changes  Medication List: Current Outpatient Medications  Medication Sig Dispense Refill  . albuterol (PROVENTIL HFA;VENTOLIN HFA) 108 (90 Base) MCG/ACT inhaler Inhale 2 puffs into the  lungs every 6 (six) hours as needed for wheezing or shortness of breath. 1 Inhaler 2  . Biotin 1000 MCG CHEW biotin    . cholecalciferol (VITAMIN D) 1000 UNITS tablet Take 1,000 Units by mouth daily.    . fish oil-omega-3 fatty acids 1000 MG capsule Take 1 g by mouth daily.    . Multiple Vitamin (MULTIVITAMIN WITH MINERALS) TABS tablet Take 1 tablet by mouth daily.    Marland Kitchen UNABLE TO FIND Allergy Injections    . valACYclovir (VALTREX) 500 MG tablet TAKE 1 TABLET BY MOUTH TWICE DAILY FOR 5 DAYS AS NEEDED FOR OUTBREAK  12  . vitamin C (ASCORBIC ACID) 500 MG tablet Take 500 mg by mouth daily.    . vitamin E 400 UNIT capsule Take 400 Units by mouth daily.     No current facility-administered medications for this visit.     Known medication allergies: Allergies  Allergen Reactions  . Chlorthalidone Other (See Comments)    Potassium drop, hospitalized      Physical examination: Blood pressure 122/70, pulse 78, temperature 98.1 F (36.7 C), temperature source Temporal, resp. rate 18, height 5' 10.5" (1.791 m), weight 135 lb (61.2 kg), SpO2 97 %.  General: Alert, interactive, in no acute distress. HEENT: PERRLA, TMs pearly gray, turbinates non-edematous without discharge, post-pharynx non erythematous. Neck: Supple without lymphadenopathy. Lungs: Clear to auscultation without wheezing, rhonchi or rales. {no increased work of breathing. CV: Normal S1, S2 without murmurs. Abdomen: Nondistended, nontender. Skin: Warm and dry, without lesions or rashes. Extremities:  No clubbing, cyanosis or edema. Neuro:   Grossly intact.  Diagnositics/Labs:  Spirometry: FEV1: 2.33L 71%, FVC: 3.4L 81% predicted.  This is stable from her previous study.  She received her allergen immunotherapy injections today.  Assessment and plan: Allergic rhinitis with conjunctivitis    - continue avoidance measures for grass pollens, weeds, trees, molds, dust mites, cat, cockroach.     - continue immunotherapy per  schedule     - have access to epinephrine device Wynona Luna)    - continue allegra 180mg  daily as needed  Allergic asthma   - have access to albuterol inhaler 2 puffs every 4-6 hours as needed for cough/wheeze/shortness of breath/chest tightness.  May use 15-20 minutes prior to activity.   Monitor frequency of use.    Pollen food allergy syndrome   - The oral allergy syndrome (OAS) or pollen-food allergy syndrome (PFAS) is a relatively common form of food allergy, particularly in adults. It typically occurs in people who have pollen allergies when the immune system "sees" proteins on the food that look like proteins on the pollen. This results in the allergy antibody (IgE) binding to the food instead of the pollen. Patients typically report itching and/or mild swelling of the mouth and throat immediately following ingestion of certain uncooked fruits (including nuts) or raw vegetables. Only a very small number of affected individuals experience systemic allergic reactions, such as anaphylaxis which occurs with true food allergies.    - able to eat almond without issue now!   Follow-up for 12  months or sooner if needed   I appreciate the opportunity to take part in Renee Hansen's care. Please do not hesitate to contact me with questions.  Sincerely,   Prudy Feeler, MD Allergy/Immunology [facility]

## 2020-02-27 ENCOUNTER — Ambulatory Visit (INDEPENDENT_AMBULATORY_CARE_PROVIDER_SITE_OTHER): Payer: BC Managed Care – PPO

## 2020-02-27 DIAGNOSIS — J3089 Other allergic rhinitis: Secondary | ICD-10-CM

## 2020-03-05 ENCOUNTER — Ambulatory Visit (INDEPENDENT_AMBULATORY_CARE_PROVIDER_SITE_OTHER): Payer: BC Managed Care – PPO

## 2020-03-05 DIAGNOSIS — J309 Allergic rhinitis, unspecified: Secondary | ICD-10-CM

## 2020-03-12 ENCOUNTER — Ambulatory Visit (INDEPENDENT_AMBULATORY_CARE_PROVIDER_SITE_OTHER): Payer: BC Managed Care – PPO

## 2020-03-12 DIAGNOSIS — J309 Allergic rhinitis, unspecified: Secondary | ICD-10-CM | POA: Diagnosis not present

## 2020-03-19 ENCOUNTER — Ambulatory Visit (INDEPENDENT_AMBULATORY_CARE_PROVIDER_SITE_OTHER): Payer: BC Managed Care – PPO

## 2020-03-19 DIAGNOSIS — J309 Allergic rhinitis, unspecified: Secondary | ICD-10-CM | POA: Diagnosis not present

## 2020-03-26 ENCOUNTER — Ambulatory Visit (INDEPENDENT_AMBULATORY_CARE_PROVIDER_SITE_OTHER): Payer: BC Managed Care – PPO

## 2020-03-26 DIAGNOSIS — J309 Allergic rhinitis, unspecified: Secondary | ICD-10-CM | POA: Diagnosis not present

## 2020-04-04 DIAGNOSIS — H35033 Hypertensive retinopathy, bilateral: Secondary | ICD-10-CM | POA: Diagnosis not present

## 2020-04-23 ENCOUNTER — Ambulatory Visit (INDEPENDENT_AMBULATORY_CARE_PROVIDER_SITE_OTHER): Payer: BC Managed Care – PPO

## 2020-04-23 DIAGNOSIS — J309 Allergic rhinitis, unspecified: Secondary | ICD-10-CM | POA: Diagnosis not present

## 2020-05-17 DIAGNOSIS — H35033 Hypertensive retinopathy, bilateral: Secondary | ICD-10-CM | POA: Diagnosis not present

## 2020-05-17 DIAGNOSIS — Z13220 Encounter for screening for lipoid disorders: Secondary | ICD-10-CM | POA: Diagnosis not present

## 2020-05-17 DIAGNOSIS — Z0000 Encounter for general adult medical examination without abnormal findings: Secondary | ICD-10-CM | POA: Diagnosis not present

## 2020-05-21 ENCOUNTER — Ambulatory Visit (INDEPENDENT_AMBULATORY_CARE_PROVIDER_SITE_OTHER): Payer: BC Managed Care – PPO

## 2020-05-21 DIAGNOSIS — J309 Allergic rhinitis, unspecified: Secondary | ICD-10-CM | POA: Diagnosis not present

## 2020-06-18 ENCOUNTER — Ambulatory Visit (INDEPENDENT_AMBULATORY_CARE_PROVIDER_SITE_OTHER): Payer: BC Managed Care – PPO

## 2020-06-18 DIAGNOSIS — J309 Allergic rhinitis, unspecified: Secondary | ICD-10-CM

## 2020-07-05 DIAGNOSIS — J3089 Other allergic rhinitis: Secondary | ICD-10-CM | POA: Diagnosis not present

## 2020-07-16 ENCOUNTER — Ambulatory Visit (INDEPENDENT_AMBULATORY_CARE_PROVIDER_SITE_OTHER): Payer: BC Managed Care – PPO

## 2020-07-16 DIAGNOSIS — J309 Allergic rhinitis, unspecified: Secondary | ICD-10-CM | POA: Diagnosis not present

## 2020-08-14 ENCOUNTER — Ambulatory Visit (INDEPENDENT_AMBULATORY_CARE_PROVIDER_SITE_OTHER): Payer: BC Managed Care – PPO | Admitting: *Deleted

## 2020-08-14 DIAGNOSIS — J309 Allergic rhinitis, unspecified: Secondary | ICD-10-CM | POA: Diagnosis not present

## 2020-09-11 ENCOUNTER — Ambulatory Visit (INDEPENDENT_AMBULATORY_CARE_PROVIDER_SITE_OTHER): Payer: BC Managed Care – PPO

## 2020-09-11 DIAGNOSIS — J309 Allergic rhinitis, unspecified: Secondary | ICD-10-CM

## 2020-10-08 ENCOUNTER — Ambulatory Visit (INDEPENDENT_AMBULATORY_CARE_PROVIDER_SITE_OTHER): Payer: BC Managed Care – PPO | Admitting: *Deleted

## 2020-10-08 DIAGNOSIS — J309 Allergic rhinitis, unspecified: Secondary | ICD-10-CM

## 2020-10-15 ENCOUNTER — Ambulatory Visit (INDEPENDENT_AMBULATORY_CARE_PROVIDER_SITE_OTHER): Payer: BC Managed Care – PPO | Admitting: *Deleted

## 2020-10-15 DIAGNOSIS — J309 Allergic rhinitis, unspecified: Secondary | ICD-10-CM | POA: Diagnosis not present

## 2020-10-22 ENCOUNTER — Ambulatory Visit (INDEPENDENT_AMBULATORY_CARE_PROVIDER_SITE_OTHER): Payer: BC Managed Care – PPO | Admitting: *Deleted

## 2020-10-22 DIAGNOSIS — J309 Allergic rhinitis, unspecified: Secondary | ICD-10-CM

## 2020-10-30 ENCOUNTER — Ambulatory Visit (INDEPENDENT_AMBULATORY_CARE_PROVIDER_SITE_OTHER): Payer: BC Managed Care – PPO

## 2020-10-30 DIAGNOSIS — J309 Allergic rhinitis, unspecified: Secondary | ICD-10-CM | POA: Diagnosis not present

## 2020-11-06 ENCOUNTER — Ambulatory Visit (INDEPENDENT_AMBULATORY_CARE_PROVIDER_SITE_OTHER): Payer: BC Managed Care – PPO

## 2020-11-06 DIAGNOSIS — J309 Allergic rhinitis, unspecified: Secondary | ICD-10-CM | POA: Diagnosis not present

## 2020-11-07 DIAGNOSIS — H35033 Hypertensive retinopathy, bilateral: Secondary | ICD-10-CM | POA: Diagnosis not present

## 2020-12-04 ENCOUNTER — Ambulatory Visit (INDEPENDENT_AMBULATORY_CARE_PROVIDER_SITE_OTHER): Payer: BC Managed Care – PPO

## 2020-12-04 DIAGNOSIS — J309 Allergic rhinitis, unspecified: Secondary | ICD-10-CM | POA: Diagnosis not present

## 2021-01-01 ENCOUNTER — Ambulatory Visit (INDEPENDENT_AMBULATORY_CARE_PROVIDER_SITE_OTHER): Payer: BC Managed Care – PPO

## 2021-01-01 DIAGNOSIS — J309 Allergic rhinitis, unspecified: Secondary | ICD-10-CM | POA: Diagnosis not present

## 2021-01-23 ENCOUNTER — Other Ambulatory Visit: Payer: Self-pay

## 2021-01-23 ENCOUNTER — Ambulatory Visit: Payer: BC Managed Care – PPO | Admitting: Allergy

## 2021-01-23 ENCOUNTER — Encounter: Payer: Self-pay | Admitting: Allergy

## 2021-01-23 VITALS — BP 108/68 | HR 74 | Temp 98.0°F | Resp 18 | Ht 70.5 in | Wt 135.8 lb

## 2021-01-23 DIAGNOSIS — T781XXD Other adverse food reactions, not elsewhere classified, subsequent encounter: Secondary | ICD-10-CM | POA: Diagnosis not present

## 2021-01-23 DIAGNOSIS — J3089 Other allergic rhinitis: Secondary | ICD-10-CM

## 2021-01-23 DIAGNOSIS — J4520 Mild intermittent asthma, uncomplicated: Secondary | ICD-10-CM

## 2021-01-23 DIAGNOSIS — H1013 Acute atopic conjunctivitis, bilateral: Secondary | ICD-10-CM | POA: Diagnosis not present

## 2021-01-23 NOTE — Patient Instructions (Addendum)
Allergic rhinoconjunctivitis    - continue avoidance measures for grass pollens, weeds, trees, molds, dust mites, cat, cockroach.     - continue immunotherapy per schedule as still having some symptoms at this time    - have access to epinephrine device Renee Hansen)    - continue allegra 180mg  daily as needed    - postnasal drainage becomes bothersome then can recommend a nasal antihistamine spray  Allergic asthma   - have access to albuterol inhaler 2 puffs every 4-6 hours as needed for cough/wheeze/shortness of breath/chest tightness.  May use 15-20 minutes prior to activity.   Monitor frequency of use.    Pollen food allergy syndrome   - The oral allergy syndrome (OAS) or pollen-food allergy syndrome (PFAS) is a relatively common form of food allergy, particularly in adults. It typically occurs in people who have pollen allergies when the immune system "sees" proteins on the food that look like proteins on the pollen. This results in the allergy antibody (IgE) binding to the food instead of the pollen. Patients typically report itching and/or mild swelling of the mouth and throat immediately following ingestion of certain uncooked fruits (including nuts) or raw vegetables. Only a very small number of affected individuals experience systemic allergic reactions, such as anaphylaxis which occurs with true food allergies.    -Continues to eat almonds without issue   Follow-up for 12  months or sooner if needed

## 2021-01-23 NOTE — Progress Notes (Signed)
Follow-up Note  RE: Renee Hansen: [id number] DOB: [birth year] Date of Office Visit: 01/23/2021   History of present illness: Renee Hansen is a 60 y.o. female presenting today for follow-up of allergic rhinitis with conjunctivitis, asthma and pollen food allergy syndrome.  She was last seen in the office on 01/20/2020 by myself.  She states she has done well over the past year without any major health changes, surgeries or hospitalizations.  She continues on allergen immunotherapy and is at monthly maintenance of her remixed allergen vials.  She is tolerating injections well without any large local or systemic reactions.  She will take Allegra as needed.  She does note some early morning nasal drainage with throat clearing that is been going on for the past 6 months.  She states is not terribly bothersome.  When she gets up and moving around it clears.  She continues Dayarmys without any issue.  She states she is currently not avoiding any foods at this time.     Review of systems: Review of Systems  Constitutional: Negative.   HENT:       See HPI  Eyes: Negative.   Respiratory: Negative.   Cardiovascular: Negative.   Gastrointestinal: Negative.   Musculoskeletal: Negative.   Skin: Negative.   Neurological: Negative.     All other systems negative unless noted above in HPI  Past medical/social/surgical/family history have been reviewed and are unchanged unless specifically indicated below.  No changes  Medication List: Current Outpatient Medications  Medication Sig Dispense Refill  . albuterol (PROVENTIL HFA;VENTOLIN HFA) 108 (90 Base) MCG/ACT inhaler Inhale 2 puffs into the lungs every 6 (six) hours as needed for wheezing or shortness of breath. 1 Inhaler 2  . Biotin 1000 MCG CHEW biotin    . cholecalciferol (VITAMIN D) 1000 UNITS tablet Take 1,000 Units by mouth daily.    . fish oil-omega-3 fatty acids 1000 MG capsule Take 1 g by mouth daily.    . Multiple  Vitamin (MULTIVITAMIN WITH MINERALS) TABS tablet Take 1 tablet by mouth daily.    . valACYclovir (VALTREX) 500 MG tablet TAKE 1 TABLET BY MOUTH TWICE DAILY FOR 5 DAYS AS NEEDED FOR OUTBREAK  12  . vitamin C (ASCORBIC ACID) 500 MG tablet Take 500 mg by mouth daily.    . vitamin E 400 UNIT capsule Take 400 Units by mouth daily.    . Multiple Vitamin (M.V.I. ADULT IV) See admin instructions.    Marland Kitchen UNABLE TO FIND Allergy Injections     No current facility-administered medications for this visit.     Known medication allergies: Allergies  Allergen Reactions  . Chlorthalidone Other (See Comments)    Potassium drop, hospitalized   . Spironolactone Other (See Comments)     Physical examination: Blood pressure 108/68, pulse 74, temperature 98 F (36.7 C), resp. rate 18, height 5' 10.5" (1.791 m), weight 135 lb 12.8 oz (61.6 kg), SpO2 99 %.  General: Alert, interactive, in no acute distress. HEENT: PERRLA, TMs pearly gray, turbinates non-edematous without discharge, post-pharynx non erythematous. Neck: Supple without lymphadenopathy. Lungs: Clear to auscultation without wheezing, rhonchi or rales. {no increased work of breathing. CV: Normal S1, S2 without murmurs. Abdomen: Nondistended, nontender. Skin: Warm and dry, without lesions or rashes. Extremities:  No clubbing, cyanosis or edema. Neuro:   Grossly intact.  Diagnositics/Labs: None today  Assessment and plan:   Allergic rhinoconjunctivitis    - continue avoidance measures for grass pollens, weeds, trees, molds, dust mites,  cat, cockroach.     - continue immunotherapy per schedule as still having some symptoms at this time    - have access to epinephrine device Renee Hansen)    - continue allegra 180mg  daily as needed    - postnasal drainage becomes bothersome then can recommend a nasal antihistamine spray  Allergic asthma   - have access to albuterol inhaler 2 puffs every 4-6 hours as needed for cough/wheeze/shortness of  breath/chest tightness.  May use 15-20 minutes prior to activity.   Monitor frequency of use.    Pollen food allergy syndrome   - The oral allergy syndrome (OAS) or pollen-food allergy syndrome (PFAS) is a relatively common form of food allergy, particularly in adults. It typically occurs in people who have pollen allergies when the immune system "sees" proteins on the food that look like proteins on the pollen. This results in the allergy antibody (IgE) binding to the food instead of the pollen. Patients typically report itching and/or mild swelling of the mouth and throat immediately following ingestion of certain uncooked fruits (including nuts) or raw vegetables. Only a very small number of affected individuals experience systemic allergic reactions, such as anaphylaxis which occurs with true food allergies.    -Continues to eat almonds without issue   Follow-up for 12  months or sooner if needed  I appreciate the opportunity to take part in Renee Hansen's care. Please do not hesitate to contact me with questions.  Sincerely,   , MD Allergy/Immunology [facility]

## 2021-01-29 ENCOUNTER — Ambulatory Visit (INDEPENDENT_AMBULATORY_CARE_PROVIDER_SITE_OTHER): Payer: BC Managed Care – PPO

## 2021-01-29 DIAGNOSIS — J309 Allergic rhinitis, unspecified: Secondary | ICD-10-CM | POA: Diagnosis not present

## 2021-02-13 DIAGNOSIS — Z124 Encounter for screening for malignant neoplasm of cervix: Secondary | ICD-10-CM | POA: Diagnosis not present

## 2021-02-13 DIAGNOSIS — Z130 Encounter for screening for diseases of the blood and blood-forming organs and certain disorders involving the immune mechanism: Secondary | ICD-10-CM | POA: Diagnosis not present

## 2021-02-13 DIAGNOSIS — Z1231 Encounter for screening mammogram for malignant neoplasm of breast: Secondary | ICD-10-CM | POA: Diagnosis not present

## 2021-02-13 DIAGNOSIS — Z01419 Encounter for gynecological examination (general) (routine) without abnormal findings: Secondary | ICD-10-CM | POA: Diagnosis not present

## 2021-02-13 DIAGNOSIS — Z1151 Encounter for screening for human papillomavirus (HPV): Secondary | ICD-10-CM | POA: Diagnosis not present

## 2021-02-13 DIAGNOSIS — Z681 Body mass index (BMI) 19 or less, adult: Secondary | ICD-10-CM | POA: Diagnosis not present

## 2021-02-25 ENCOUNTER — Ambulatory Visit (INDEPENDENT_AMBULATORY_CARE_PROVIDER_SITE_OTHER): Payer: BC Managed Care – PPO

## 2021-02-25 DIAGNOSIS — J309 Allergic rhinitis, unspecified: Secondary | ICD-10-CM

## 2021-03-25 ENCOUNTER — Ambulatory Visit (INDEPENDENT_AMBULATORY_CARE_PROVIDER_SITE_OTHER): Payer: BC Managed Care – PPO | Admitting: *Deleted

## 2021-03-25 DIAGNOSIS — J309 Allergic rhinitis, unspecified: Secondary | ICD-10-CM

## 2021-04-02 DIAGNOSIS — J3089 Other allergic rhinitis: Secondary | ICD-10-CM | POA: Diagnosis not present

## 2021-04-02 NOTE — Progress Notes (Signed)
VIALS EXP 04-02-22 

## 2021-04-24 ENCOUNTER — Ambulatory Visit (INDEPENDENT_AMBULATORY_CARE_PROVIDER_SITE_OTHER): Payer: BC Managed Care – PPO

## 2021-04-24 DIAGNOSIS — J309 Allergic rhinitis, unspecified: Secondary | ICD-10-CM | POA: Diagnosis not present

## 2021-05-20 ENCOUNTER — Ambulatory Visit (INDEPENDENT_AMBULATORY_CARE_PROVIDER_SITE_OTHER): Payer: BC Managed Care – PPO

## 2021-05-20 DIAGNOSIS — J4520 Mild intermittent asthma, uncomplicated: Secondary | ICD-10-CM | POA: Diagnosis not present

## 2021-05-20 DIAGNOSIS — Z131 Encounter for screening for diabetes mellitus: Secondary | ICD-10-CM | POA: Diagnosis not present

## 2021-05-20 DIAGNOSIS — Z0000 Encounter for general adult medical examination without abnormal findings: Secondary | ICD-10-CM | POA: Diagnosis not present

## 2021-05-20 DIAGNOSIS — F39 Unspecified mood [affective] disorder: Secondary | ICD-10-CM | POA: Diagnosis not present

## 2021-05-20 DIAGNOSIS — J309 Allergic rhinitis, unspecified: Secondary | ICD-10-CM

## 2021-05-20 DIAGNOSIS — Z13220 Encounter for screening for lipoid disorders: Secondary | ICD-10-CM | POA: Diagnosis not present

## 2021-05-20 DIAGNOSIS — R42 Dizziness and giddiness: Secondary | ICD-10-CM | POA: Diagnosis not present

## 2021-05-20 DIAGNOSIS — Z23 Encounter for immunization: Secondary | ICD-10-CM | POA: Diagnosis not present

## 2021-05-20 DIAGNOSIS — B009 Herpesviral infection, unspecified: Secondary | ICD-10-CM | POA: Diagnosis not present

## 2021-05-20 DIAGNOSIS — J301 Allergic rhinitis due to pollen: Secondary | ICD-10-CM | POA: Diagnosis not present

## 2021-05-24 DIAGNOSIS — Z13820 Encounter for screening for osteoporosis: Secondary | ICD-10-CM | POA: Diagnosis not present

## 2021-05-24 DIAGNOSIS — M85852 Other specified disorders of bone density and structure, left thigh: Secondary | ICD-10-CM | POA: Diagnosis not present

## 2021-05-24 DIAGNOSIS — M8588 Other specified disorders of bone density and structure, other site: Secondary | ICD-10-CM | POA: Diagnosis not present

## 2021-05-28 ENCOUNTER — Ambulatory Visit (INDEPENDENT_AMBULATORY_CARE_PROVIDER_SITE_OTHER): Payer: BC Managed Care – PPO | Admitting: *Deleted

## 2021-05-28 DIAGNOSIS — J309 Allergic rhinitis, unspecified: Secondary | ICD-10-CM

## 2021-06-04 ENCOUNTER — Ambulatory Visit (INDEPENDENT_AMBULATORY_CARE_PROVIDER_SITE_OTHER): Payer: BC Managed Care – PPO | Admitting: *Deleted

## 2021-06-04 DIAGNOSIS — J309 Allergic rhinitis, unspecified: Secondary | ICD-10-CM

## 2021-06-11 ENCOUNTER — Ambulatory Visit (INDEPENDENT_AMBULATORY_CARE_PROVIDER_SITE_OTHER): Payer: BC Managed Care – PPO | Admitting: *Deleted

## 2021-06-11 DIAGNOSIS — J309 Allergic rhinitis, unspecified: Secondary | ICD-10-CM

## 2021-06-17 ENCOUNTER — Ambulatory Visit (INDEPENDENT_AMBULATORY_CARE_PROVIDER_SITE_OTHER): Payer: BC Managed Care – PPO | Admitting: *Deleted

## 2021-06-17 DIAGNOSIS — J309 Allergic rhinitis, unspecified: Secondary | ICD-10-CM | POA: Diagnosis not present

## 2021-07-16 ENCOUNTER — Ambulatory Visit (INDEPENDENT_AMBULATORY_CARE_PROVIDER_SITE_OTHER): Payer: BC Managed Care – PPO | Admitting: *Deleted

## 2021-07-16 DIAGNOSIS — J309 Allergic rhinitis, unspecified: Secondary | ICD-10-CM

## 2021-08-13 ENCOUNTER — Ambulatory Visit (INDEPENDENT_AMBULATORY_CARE_PROVIDER_SITE_OTHER): Payer: BC Managed Care – PPO | Admitting: *Deleted

## 2021-08-13 DIAGNOSIS — J309 Allergic rhinitis, unspecified: Secondary | ICD-10-CM | POA: Diagnosis not present

## 2021-09-09 ENCOUNTER — Ambulatory Visit (INDEPENDENT_AMBULATORY_CARE_PROVIDER_SITE_OTHER): Payer: BC Managed Care – PPO

## 2021-09-09 DIAGNOSIS — J309 Allergic rhinitis, unspecified: Secondary | ICD-10-CM

## 2021-10-08 ENCOUNTER — Ambulatory Visit (INDEPENDENT_AMBULATORY_CARE_PROVIDER_SITE_OTHER): Payer: BC Managed Care – PPO | Admitting: *Deleted

## 2021-10-08 DIAGNOSIS — J309 Allergic rhinitis, unspecified: Secondary | ICD-10-CM | POA: Diagnosis not present

## 2021-11-05 ENCOUNTER — Ambulatory Visit (INDEPENDENT_AMBULATORY_CARE_PROVIDER_SITE_OTHER): Payer: BC Managed Care – PPO

## 2021-11-05 DIAGNOSIS — J309 Allergic rhinitis, unspecified: Secondary | ICD-10-CM

## 2021-11-07 DIAGNOSIS — J3081 Allergic rhinitis due to animal (cat) (dog) hair and dander: Secondary | ICD-10-CM

## 2021-11-08 NOTE — Progress Notes (Signed)
Exp 11/11/22 

## 2021-12-12 ENCOUNTER — Ambulatory Visit (INDEPENDENT_AMBULATORY_CARE_PROVIDER_SITE_OTHER): Payer: BC Managed Care – PPO

## 2021-12-12 DIAGNOSIS — J309 Allergic rhinitis, unspecified: Secondary | ICD-10-CM

## 2021-12-17 ENCOUNTER — Other Ambulatory Visit: Payer: Self-pay

## 2021-12-17 ENCOUNTER — Ambulatory Visit: Payer: BC Managed Care – PPO | Admitting: Physician Assistant

## 2021-12-17 ENCOUNTER — Encounter: Payer: Self-pay | Admitting: Physician Assistant

## 2021-12-17 DIAGNOSIS — Z1283 Encounter for screening for malignant neoplasm of skin: Secondary | ICD-10-CM

## 2021-12-17 DIAGNOSIS — L821 Other seborrheic keratosis: Secondary | ICD-10-CM | POA: Diagnosis not present

## 2021-12-17 DIAGNOSIS — B079 Viral wart, unspecified: Secondary | ICD-10-CM | POA: Diagnosis not present

## 2021-12-17 DIAGNOSIS — L729 Follicular cyst of the skin and subcutaneous tissue, unspecified: Secondary | ICD-10-CM

## 2021-12-17 NOTE — Patient Instructions (Signed)
IF YOU DECIDE THAT YOU WANT THE CYST ON THE CHEST REMOVED, CALL AND SCHEDULE A 30 MIN SURGERY. BE SURE TO SAY THAT YOU HAVE SEEN THE PROVIDER ABOUT IT ALREADY

## 2021-12-23 ENCOUNTER — Encounter: Payer: Self-pay | Admitting: Physician Assistant

## 2021-12-23 NOTE — Progress Notes (Signed)
° °  New Patient   Subjective  Renee Hansen is a 61 y.o. female who presents for the following: Annual Exam (Right shoulder dark lat area, spine lesion x years dry to touch, forehead x years no bleeding).   The following portions of the chart were reviewed this encounter and updated as appropriate:  Tobacco   Allergies   Meds   Problems   Med Hx   Surg Hx   Fam Hx       Objective  Well appearing patient in no apparent distress; mood and affect are within normal limits.  A full examination was performed including scalp, head, eyes, ears, nose, lips, neck, chest, axillae, abdomen, back, buttocks, bilateral upper extremities, bilateral lower extremities, hands, feet, fingers, toes, fingernails, and toenails. All findings within normal limits unless otherwise noted below.  Left Lower Leg - Anterior, Right Lower Leg - Anterior "Perfectly Round" brown crust.  Chest - Medial (Center) Large open comedome.   Right Shoulder - Posterior Verrucous papules -- Discussed viral etiology and contagion.    Assessment & Plan  Seborrheic keratosis (2) Left Lower Leg - Anterior; Right Lower Leg - Anterior  Observe. Return to clinic if changes.   Cyst of skin Chest - Medial (Center)  Observe.   Verruca vulgaris Right Shoulder - Posterior  observe     I, Levi Klaiber, PA-C, have reviewed all documentation's for this visit.  The documentation on 12/23/21 for the exam, diagnosis, procedures and orders are all accurate and complete.

## 2022-01-14 ENCOUNTER — Ambulatory Visit (INDEPENDENT_AMBULATORY_CARE_PROVIDER_SITE_OTHER): Payer: BC Managed Care – PPO | Admitting: *Deleted

## 2022-01-14 DIAGNOSIS — J309 Allergic rhinitis, unspecified: Secondary | ICD-10-CM

## 2022-01-21 ENCOUNTER — Ambulatory Visit (INDEPENDENT_AMBULATORY_CARE_PROVIDER_SITE_OTHER): Payer: BC Managed Care – PPO

## 2022-01-21 DIAGNOSIS — J309 Allergic rhinitis, unspecified: Secondary | ICD-10-CM | POA: Diagnosis not present

## 2022-01-28 ENCOUNTER — Ambulatory Visit (INDEPENDENT_AMBULATORY_CARE_PROVIDER_SITE_OTHER): Payer: BC Managed Care – PPO

## 2022-01-28 DIAGNOSIS — J309 Allergic rhinitis, unspecified: Secondary | ICD-10-CM | POA: Diagnosis not present

## 2022-02-04 ENCOUNTER — Ambulatory Visit (INDEPENDENT_AMBULATORY_CARE_PROVIDER_SITE_OTHER): Payer: BC Managed Care – PPO

## 2022-02-04 DIAGNOSIS — J309 Allergic rhinitis, unspecified: Secondary | ICD-10-CM

## 2022-02-11 ENCOUNTER — Ambulatory Visit (INDEPENDENT_AMBULATORY_CARE_PROVIDER_SITE_OTHER): Payer: BC Managed Care – PPO | Admitting: *Deleted

## 2022-02-11 DIAGNOSIS — J309 Allergic rhinitis, unspecified: Secondary | ICD-10-CM | POA: Diagnosis not present

## 2022-02-19 DIAGNOSIS — Z1231 Encounter for screening mammogram for malignant neoplasm of breast: Secondary | ICD-10-CM | POA: Diagnosis not present

## 2022-02-19 DIAGNOSIS — Z780 Asymptomatic menopausal state: Secondary | ICD-10-CM | POA: Diagnosis not present

## 2022-02-19 DIAGNOSIS — Z130 Encounter for screening for diseases of the blood and blood-forming organs and certain disorders involving the immune mechanism: Secondary | ICD-10-CM | POA: Diagnosis not present

## 2022-02-19 DIAGNOSIS — Z01419 Encounter for gynecological examination (general) (routine) without abnormal findings: Secondary | ICD-10-CM | POA: Diagnosis not present

## 2022-02-19 DIAGNOSIS — Z1389 Encounter for screening for other disorder: Secondary | ICD-10-CM | POA: Diagnosis not present

## 2022-03-11 ENCOUNTER — Ambulatory Visit (INDEPENDENT_AMBULATORY_CARE_PROVIDER_SITE_OTHER): Payer: BC Managed Care – PPO

## 2022-03-11 DIAGNOSIS — J309 Allergic rhinitis, unspecified: Secondary | ICD-10-CM

## 2022-03-20 DIAGNOSIS — Z1211 Encounter for screening for malignant neoplasm of colon: Secondary | ICD-10-CM | POA: Diagnosis not present

## 2022-03-20 DIAGNOSIS — K648 Other hemorrhoids: Secondary | ICD-10-CM | POA: Diagnosis not present

## 2022-03-20 DIAGNOSIS — D123 Benign neoplasm of transverse colon: Secondary | ICD-10-CM | POA: Diagnosis not present

## 2022-04-03 ENCOUNTER — Encounter: Payer: Self-pay | Admitting: Allergy

## 2022-04-03 ENCOUNTER — Ambulatory Visit: Payer: BC Managed Care – PPO | Admitting: Allergy

## 2022-04-03 ENCOUNTER — Other Ambulatory Visit: Payer: Self-pay

## 2022-04-03 VITALS — BP 104/70 | HR 66 | Temp 98.3°F | Resp 18 | Ht 71.0 in | Wt 128.8 lb

## 2022-04-03 DIAGNOSIS — J3089 Other allergic rhinitis: Secondary | ICD-10-CM

## 2022-04-03 DIAGNOSIS — J4520 Mild intermittent asthma, uncomplicated: Secondary | ICD-10-CM

## 2022-04-03 DIAGNOSIS — T781XXD Other adverse food reactions, not elsewhere classified, subsequent encounter: Secondary | ICD-10-CM | POA: Diagnosis not present

## 2022-04-03 DIAGNOSIS — H1013 Acute atopic conjunctivitis, bilateral: Secondary | ICD-10-CM | POA: Diagnosis not present

## 2022-04-03 NOTE — Patient Instructions (Addendum)
Allergic rhinoconjunctivitis ?   - continue avoidance measures for grass pollens, weeds, trees, molds, dust mites, cat, cockroach.  ?   - discussed from a symptom standpoint that she is doing quite well with as needed use of medications and believe that she likely has received benefits from her immunotherapy to the point where we can try to discontinue.  Her most problematic season is typically fall as I have recommended that we have her last injection in July and we will see how she does during the fall if she does not note any increase in allergy symptoms then she will remain off immunotherapy ?   - have access to epinephrine device Renee Hansen) while on immunotherapy ?   - can use allegra 180mg  as needed ? ?Allergic asthma ?  - have access to albuterol inhaler 2 puffs every 4-6 hours as needed for cough/wheeze/shortness of breath/chest tightness.  May use 15-20 minutes prior to activity.   Monitor frequency of use.   ? ?Pollen food allergy syndrome ?  - tolerating almonds in the diet at this time.  her history of pollen food allergy syndrome has resolved with immunotherapy ? ? ?Follow-up as needed if you are able to successfully discontinue immunotherapy otherwise yearly visit ?

## 2022-04-03 NOTE — Progress Notes (Signed)
? ? ?Follow-up Note ? ?RE: Renee Hansen MRN: [id number] DOB: 1961/11/02 ?Date of Office Visit: 04/03/2022 ? ? ?History of present illness: ?Renee Hansen is a 61 y.o. female presenting today for follow-up of allergic rhinitis with conjunctivitis with history of pollen food allergy syndrome, allergic asthma.  She was last seen in the office on 01/23/2021 by myself.  She has done well over the past year without any major health changes, surgeries or hospitalizations. ?For her allergies she has continued on allergen immunotherapy at maintenance monthly dosing.  She tolerates the injections well without large local or systemic reactions.  She states this spring she has not noted any significant increase in allergy symptoms.  She may need to take an Allegra 1-2 times a months if she is having more drainage or puffiness of the eyes.  She has been on immunotherapy for over a decade. ?She is able to eat almonds without any issue at this time. ?With her allergic asthma she has not had any symptoms and denies any need for albuterol use since her last visit. ? ? ?Review of systems in the past 4 weeks: ?Review of Systems  ?Constitutional: Negative.   ?HENT: Negative.    ?Eyes: Negative.   ?Respiratory: Negative.    ?Cardiovascular: Negative.   ?Gastrointestinal: Negative.   ?Musculoskeletal: Negative.   ?Skin: Negative.   ?Allergic/Immunologic: Negative.   ?Neurological: Negative.    ? ?All other systems negative unless noted above in HPI ? ?Past medical/social/surgical/family history have been reviewed and are unchanged unless specifically indicated below. ? ?No changes ? ?Medication List: ?Current Outpatient Medications  ?Medication Sig Dispense Refill  ? albuterol (PROVENTIL HFA;VENTOLIN HFA) 108 (90 Base) MCG/ACT inhaler Inhale 2 puffs into the lungs every 6 (six) hours as needed for wheezing or shortness of breath. 1 Inhaler 2  ? Biotin 1000 MCG CHEW biotin    ? cholecalciferol (VITAMIN D) 1000 UNITS tablet Take  1,000 Units by mouth daily.    ? fish oil-omega-3 fatty acids 1000 MG capsule Take 1 g by mouth daily.    ? Multiple Vitamin (M.V.I. ADULT IV) See admin instructions.    ? Multiple Vitamin (MULTIVITAMIN WITH MINERALS) TABS tablet Take 1 tablet by mouth daily.    ? UNABLE TO FIND Allergy Injections    ? valACYclovir (VALTREX) 500 MG tablet TAKE 1 TABLET BY MOUTH TWICE DAILY FOR 5 DAYS AS NEEDED FOR OUTBREAK  12  ? vitamin C (ASCORBIC ACID) 500 MG tablet Take 500 mg by mouth daily.    ? vitamin E 400 UNIT capsule Take 400 Units by mouth daily.    ? ?No current facility-administered medications for this visit.  ?  ? ?Known medication allergies: ?Allergies  ?Allergen Reactions  ? Chlorthalidone Other (See Comments)  ?  Potassium drop, hospitalized   ? Spironolactone Other (See Comments)  ? ? ? ?Physical examination: ?Blood pressure 104/70, pulse 66, temperature 98.3 ?F (36.8 ?C), resp. rate 18, height 5\' 11"  (1.803 m), weight 128 lb 12.8 oz (58.4 kg), SpO2 97 %. ? ?General: Alert, interactive, in no acute distress. ?HEENT: PERRLA, TMs pearly gray, turbinates non-edematous without discharge, post-pharynx non erythematous. ?Neck: Supple without lymphadenopathy. ?Lungs: Clear to auscultation without wheezing, rhonchi or rales. {no increased work of breathing. ?CV: Normal S1, S2 without murmurs. ?Abdomen: Nondistended, nontender. ?Skin: Warm and dry, without lesions or rashes. ?Extremities:  No clubbing, cyanosis or edema. ?Neuro:   Grossly intact. ? ?Diagnositics/Labs: ?None today ? ?Assessment and plan: ?  ?Allergic rhinoconjunctivitis ?   -  continue avoidance measures for grass pollens, weeds, trees, molds, dust mites, cat, cockroach.  ?   - discussed from a symptom standpoint that she is doing quite well with as needed use of medications and believe that she likely has received benefits from her immunotherapy to the point where we can try to discontinue.  Her most problematic season is typically fall as I have  recommended that we have her last injection in July and we will see how she does during the fall if she does not note any increase in allergy symptoms then she will remain off immunotherapy ?   - have access to epinephrine device Audry Riles) while on immunotherapy ?   - can use allegra 180mg  as needed ? ?Allergic asthma ?  - have access to albuterol inhaler 2 puffs every 4-6 hours as needed for cough/wheeze/shortness of breath/chest tightness.  May use 15-20 minutes prior to activity.   Monitor frequency of use.   ? ?Pollen food allergy syndrome ?  - tolerating almonds in the diet at this time.  her history of pollen food allergy syndrome has resolved with immunotherapy ? ?Follow-up as needed if you are able to successfully discontinue immunotherapy otherwise yearly visit ? ?I appreciate the opportunity to take part in Renee Hansen's care. Please do not hesitate to contact me with questions. ? ?Sincerely, ? ? ? , MD ?Allergy/Immunology ?[facility] ? ? ?

## 2022-04-08 ENCOUNTER — Ambulatory Visit (INDEPENDENT_AMBULATORY_CARE_PROVIDER_SITE_OTHER): Payer: BC Managed Care – PPO

## 2022-04-08 DIAGNOSIS — J309 Allergic rhinitis, unspecified: Secondary | ICD-10-CM

## 2022-05-02 DIAGNOSIS — M9904 Segmental and somatic dysfunction of sacral region: Secondary | ICD-10-CM | POA: Diagnosis not present

## 2022-05-02 DIAGNOSIS — M9903 Segmental and somatic dysfunction of lumbar region: Secondary | ICD-10-CM | POA: Diagnosis not present

## 2022-05-02 DIAGNOSIS — M9902 Segmental and somatic dysfunction of thoracic region: Secondary | ICD-10-CM | POA: Diagnosis not present

## 2022-05-02 DIAGNOSIS — M47816 Spondylosis without myelopathy or radiculopathy, lumbar region: Secondary | ICD-10-CM | POA: Diagnosis not present

## 2022-05-02 DIAGNOSIS — M5386 Other specified dorsopathies, lumbar region: Secondary | ICD-10-CM | POA: Diagnosis not present

## 2022-05-05 ENCOUNTER — Ambulatory Visit (INDEPENDENT_AMBULATORY_CARE_PROVIDER_SITE_OTHER): Payer: BC Managed Care – PPO

## 2022-05-05 DIAGNOSIS — M5386 Other specified dorsopathies, lumbar region: Secondary | ICD-10-CM | POA: Diagnosis not present

## 2022-05-05 DIAGNOSIS — J309 Allergic rhinitis, unspecified: Secondary | ICD-10-CM | POA: Diagnosis not present

## 2022-05-05 DIAGNOSIS — M9903 Segmental and somatic dysfunction of lumbar region: Secondary | ICD-10-CM | POA: Diagnosis not present

## 2022-05-05 DIAGNOSIS — M47816 Spondylosis without myelopathy or radiculopathy, lumbar region: Secondary | ICD-10-CM | POA: Diagnosis not present

## 2022-05-05 DIAGNOSIS — M9904 Segmental and somatic dysfunction of sacral region: Secondary | ICD-10-CM | POA: Diagnosis not present

## 2022-05-07 DIAGNOSIS — M47816 Spondylosis without myelopathy or radiculopathy, lumbar region: Secondary | ICD-10-CM | POA: Diagnosis not present

## 2022-05-07 DIAGNOSIS — M5386 Other specified dorsopathies, lumbar region: Secondary | ICD-10-CM | POA: Diagnosis not present

## 2022-05-07 DIAGNOSIS — M9903 Segmental and somatic dysfunction of lumbar region: Secondary | ICD-10-CM | POA: Diagnosis not present

## 2022-05-07 DIAGNOSIS — M9904 Segmental and somatic dysfunction of sacral region: Secondary | ICD-10-CM | POA: Diagnosis not present

## 2022-05-12 DIAGNOSIS — M5386 Other specified dorsopathies, lumbar region: Secondary | ICD-10-CM | POA: Diagnosis not present

## 2022-05-12 DIAGNOSIS — M9903 Segmental and somatic dysfunction of lumbar region: Secondary | ICD-10-CM | POA: Diagnosis not present

## 2022-05-12 DIAGNOSIS — M47816 Spondylosis without myelopathy or radiculopathy, lumbar region: Secondary | ICD-10-CM | POA: Diagnosis not present

## 2022-05-12 DIAGNOSIS — M9904 Segmental and somatic dysfunction of sacral region: Secondary | ICD-10-CM | POA: Diagnosis not present

## 2022-05-19 DIAGNOSIS — M9903 Segmental and somatic dysfunction of lumbar region: Secondary | ICD-10-CM | POA: Diagnosis not present

## 2022-05-19 DIAGNOSIS — M47816 Spondylosis without myelopathy or radiculopathy, lumbar region: Secondary | ICD-10-CM | POA: Diagnosis not present

## 2022-05-19 DIAGNOSIS — M5386 Other specified dorsopathies, lumbar region: Secondary | ICD-10-CM | POA: Diagnosis not present

## 2022-05-19 DIAGNOSIS — M9904 Segmental and somatic dysfunction of sacral region: Secondary | ICD-10-CM | POA: Diagnosis not present

## 2022-05-26 DIAGNOSIS — M9904 Segmental and somatic dysfunction of sacral region: Secondary | ICD-10-CM | POA: Diagnosis not present

## 2022-05-26 DIAGNOSIS — M5386 Other specified dorsopathies, lumbar region: Secondary | ICD-10-CM | POA: Diagnosis not present

## 2022-05-26 DIAGNOSIS — M47816 Spondylosis without myelopathy or radiculopathy, lumbar region: Secondary | ICD-10-CM | POA: Diagnosis not present

## 2022-05-26 DIAGNOSIS — M9903 Segmental and somatic dysfunction of lumbar region: Secondary | ICD-10-CM | POA: Diagnosis not present

## 2022-05-30 DIAGNOSIS — M8588 Other specified disorders of bone density and structure, other site: Secondary | ICD-10-CM | POA: Diagnosis not present

## 2022-05-30 DIAGNOSIS — Z0000 Encounter for general adult medical examination without abnormal findings: Secondary | ICD-10-CM | POA: Diagnosis not present

## 2022-05-30 DIAGNOSIS — Z23 Encounter for immunization: Secondary | ICD-10-CM | POA: Diagnosis not present

## 2022-05-30 DIAGNOSIS — Z13220 Encounter for screening for lipoid disorders: Secondary | ICD-10-CM | POA: Diagnosis not present

## 2022-05-31 ENCOUNTER — Emergency Department (HOSPITAL_COMMUNITY): Payer: BC Managed Care – PPO

## 2022-05-31 ENCOUNTER — Other Ambulatory Visit: Payer: Self-pay

## 2022-05-31 ENCOUNTER — Emergency Department (HOSPITAL_COMMUNITY)
Admission: EM | Admit: 2022-05-31 | Discharge: 2022-05-31 | Disposition: A | Discharge status: Home / Self Care | Payer: BC Managed Care – PPO | Attending: Emergency Medicine | Admitting: Emergency Medicine

## 2022-05-31 ENCOUNTER — Encounter (HOSPITAL_COMMUNITY): Payer: Self-pay

## 2022-05-31 DIAGNOSIS — J189 Pneumonia, unspecified organism: Secondary | ICD-10-CM | POA: Diagnosis not present

## 2022-05-31 DIAGNOSIS — R55 Syncope and collapse: Secondary | ICD-10-CM | POA: Diagnosis not present

## 2022-05-31 DIAGNOSIS — I951 Orthostatic hypotension: Secondary | ICD-10-CM | POA: Insufficient documentation

## 2022-05-31 DIAGNOSIS — R42 Dizziness and giddiness: Secondary | ICD-10-CM | POA: Diagnosis not present

## 2022-05-31 DIAGNOSIS — I959 Hypotension, unspecified: Secondary | ICD-10-CM | POA: Diagnosis not present

## 2022-05-31 DIAGNOSIS — S0990XA Unspecified injury of head, initial encounter: Secondary | ICD-10-CM | POA: Diagnosis not present

## 2022-05-31 DIAGNOSIS — S199XXA Unspecified injury of neck, initial encounter: Secondary | ICD-10-CM | POA: Diagnosis not present

## 2022-05-31 DIAGNOSIS — R739 Hyperglycemia, unspecified: Secondary | ICD-10-CM | POA: Insufficient documentation

## 2022-05-31 DIAGNOSIS — I6782 Cerebral ischemia: Secondary | ICD-10-CM | POA: Diagnosis not present

## 2022-05-31 LAB — HEPATIC FUNCTION PANEL
ALT: 20 U/L (ref 0–44)
AST: 26 U/L (ref 15–41)
Albumin: 3.9 g/dL (ref 3.5–5.0)
Alkaline Phosphatase: 44 U/L (ref 38–126)
Bilirubin, Direct: 0.2 mg/dL (ref 0.0–0.2)
Indirect Bilirubin: 0.7 mg/dL (ref 0.3–0.9)
Total Bilirubin: 0.9 mg/dL (ref 0.3–1.2)
Total Protein: 6.6 g/dL (ref 6.5–8.1)

## 2022-05-31 LAB — URINALYSIS, ROUTINE W REFLEX MICROSCOPIC
Bilirubin Urine: NEGATIVE
Glucose, UA: NEGATIVE mg/dL
Hgb urine dipstick: NEGATIVE
Ketones, ur: NEGATIVE mg/dL
Leukocytes,Ua: NEGATIVE
Nitrite: NEGATIVE
Protein, ur: NEGATIVE mg/dL
Specific Gravity, Urine: 1.011 (ref 1.005–1.030)
pH: 9 — ABNORMAL HIGH (ref 5.0–8.0)

## 2022-05-31 LAB — BASIC METABOLIC PANEL
Anion gap: 11 (ref 5–15)
BUN: 13 mg/dL (ref 8–23)
CO2: 24 mmol/L (ref 22–32)
Calcium: 9.2 mg/dL (ref 8.9–10.3)
Chloride: 100 mmol/L (ref 98–111)
Creatinine, Ser: 0.84 mg/dL (ref 0.44–1.00)
GFR, Estimated: >60 mL/min (ref >60)
Glucose, Bld: 139 mg/dL — ABNORMAL HIGH (ref 70–99)
Potassium: 3.7 mmol/L (ref 3.5–5.1)
Sodium: 135 mmol/L (ref 135–145)

## 2022-05-31 LAB — CBC
HCT: 37.1 % (ref 36.0–46.0)
Hemoglobin: 12.5 g/dL (ref 12.0–15.0)
MCH: 30.5 pg (ref 26.0–34.0)
MCHC: 33.7 g/dL (ref 30.0–36.0)
MCV: 90.5 fL (ref 80.0–100.0)
Platelets: 170 10*3/uL (ref 150–400)
RBC: 4.1 MIL/uL (ref 3.87–5.11)
RDW: 11.9 % (ref 11.5–15.5)
WBC: 7.1 10*3/uL (ref 4.0–10.5)
nRBC: 0 % (ref 0.0–0.2)

## 2022-05-31 LAB — CBG MONITORING, ED: Glucose-Capillary: 131 mg/dL — ABNORMAL HIGH (ref 70–99)

## 2022-05-31 LAB — CK: Total CK: 71 U/L (ref 38–234)

## 2022-05-31 LAB — MAGNESIUM: Magnesium: 1.9 mg/dL (ref 1.7–2.4)

## 2022-05-31 LAB — TROPONIN I (HIGH SENSITIVITY)
Troponin I (High Sensitivity): 5 ng/L (ref <18)
Troponin I (High Sensitivity): 5 ng/L (ref <18)

## 2022-05-31 MED ORDER — LACTATED RINGERS IV BOLUS
1000.0000 mL | Freq: Once | INTRAVENOUS | Status: AC
  Administered 2022-05-31: 1000 mL via INTRAVENOUS

## 2022-05-31 NOTE — ED Notes (Signed)
Pt ambulatory to and from restroom with steady gait 

## 2022-05-31 NOTE — ED Provider Notes (Signed)
[facility] EMERGENCY DEPARTMENT Provider Note   CSN: [id number] Arrival date & time: 05/31/22  0756     History {Add pertinent medical, surgical, social history, OB history to HPI:1} Chief Complaint  Patient presents with   Loss of Consciousness    Renee Hansen is a 61 y.o. female.   Loss of Consciousness      Home Medications Prior to Admission medications   Medication Sig Start Date End Date Taking? Authorizing Provider  albuterol (PROVENTIL HFA;VENTOLIN HFA) 108 (90 Base) MCG/ACT inhaler Inhale 2 puffs into the lungs every 6 (six) hours as needed for wheezing or shortness of breath. 07/05/18   Marcelyn Bruins, MD  Biotin 1000 MCG CHEW biotin    [provider]  cholecalciferol (VITAMIN D) 1000 UNITS tablet Take 1,000 Units by mouth daily.    [provider]  fish oil-omega-3 fatty acids 1000 MG capsule Take 1 g by mouth daily.    [provider]  Multiple Vitamin (M.V.I. ADULT IV) See admin instructions.    [provider]  Multiple Vitamin (MULTIVITAMIN WITH MINERALS) TABS tablet Take 1 tablet by mouth daily.    [provider]  UNABLE TO FIND Allergy Injections    [provider]  valACYclovir (VALTREX) 500 MG tablet TAKE 1 TABLET BY MOUTH TWICE DAILY FOR 5 DAYS AS NEEDED FOR OUTBREAK 06/18/18   [provider]  vitamin C (ASCORBIC ACID) 500 MG tablet Take 500 mg by mouth daily.    [provider]  vitamin E 400 UNIT capsule Take 400 Units by mouth daily.    [provider]      Allergies    Chlorthalidone and Spironolactone    Review of Systems   Review of Systems  Cardiovascular:  Positive for syncope.    Physical Exam Updated Vital Signs BP 124/72   Pulse 72   Temp 98.6 F (37 C) (Oral)   Resp 15   Ht 5\' 11"  (1.803 m)   Wt 56.2 kg   SpO2 98%   BMI 17.29 kg/m  Physical Exam  ED Results / Procedures / Treatments   Labs (all labs ordered are  listed, but only abnormal results are displayed) Labs Reviewed  BASIC METABOLIC PANEL - Abnormal; Notable for the following components:      Result Value   Glucose, Bld 139 (*)    All other components within normal limits  URINALYSIS, ROUTINE W REFLEX MICROSCOPIC - Abnormal; Notable for the following components:   pH 9.0 (*)    All other components within normal limits  CBG MONITORING, ED - Abnormal; Notable for the following components:   Glucose-Capillary 131 (*)    All other components within normal limits  CBC  HEPATIC FUNCTION PANEL  MAGNESIUM  CK  CBG MONITORING, ED  TROPONIN I (HIGH SENSITIVITY)  TROPONIN I (HIGH SENSITIVITY)    EKG EKG Interpretation  Date/Time:  Saturday May 31 2022 08:05:36 EDT Ventricular Rate:  77 PR Interval:  156 QRS Duration: 92 QT Interval:  413 QTC Calculation: 468 R Axis:   91 Text Interpretation: Sinus rhythm Right axis deviation Nonspecific T abnrm, anterolateral leads when compared to prior, similar appearance. No STEMI Confirmed by 07-01-2001 (Theda Belfast) on 05/31/2022 8:07:59 AM  Radiology DG Chest 2 View  Result Date: 05/31/2022 CLINICAL DATA:  Syncopal episode.  Lightheadedness. EXAM: CHEST - 2 VIEW COMPARISON:  Radiographs 08/07/2005. FINDINGS: The PA view was repeated. The heart size and mediastinal contours are normal. The  lungs are clear. There is no pleural effusion or pneumothorax. No acute osseous findings are identified. Telemetry leads overlie the chest. IMPRESSION: No evidence of active cardiopulmonary process. Electronically Signed   By: Carey Bullocks M.D.   On: 05/31/2022 09:21   CT Head Wo Contrast  Result Date: 05/31/2022 CLINICAL DATA:  61 year old female history of trauma from a fall associated with a syncopal event. EXAM: CT HEAD WITHOUT CONTRAST CT CERVICAL SPINE WITHOUT CONTRAST TECHNIQUE: Multidetector CT imaging of the head and cervical spine was performed following the standard protocol without intravenous contrast.  Multiplanar CT image reconstructions of the cervical spine were also generated. RADIATION DOSE REDUCTION: This exam was performed according to the departmental dose-optimization program which includes automated exposure control, adjustment of the mA and/or kV according to patient size and/or use of iterative reconstruction technique. COMPARISON:  Head CT 12/27/2015. FINDINGS: CT HEAD FINDINGS Brain: Patchy areas of mild decreased attenuation are noted throughout the deep and periventricular white matter of the cerebral hemispheres bilaterally, compatible with mild chronic microvascular ischemic disease. No evidence of acute infarction, hemorrhage, hydrocephalus, extra-axial collection or mass lesion/mass effect. Vascular: No hyperdense vessel or unexpected calcification. Skull: Normal. Negative for fracture or focal lesion. Sinuses/Orbits: No acute finding. Other: None. CT CERVICAL SPINE FINDINGS Alignment: Normal. Skull base and vertebrae: No acute fracture. No primary bone lesion or focal pathologic process. Soft tissues and spinal canal: No prevertebral fluid or swelling. No visible canal hematoma. Disc levels: No significant degenerative disc disease or facet arthropathy. Upper chest: Unremarkable. Other: None. IMPRESSION: 1. No evidence of significant acute traumatic injury to the skull, brain or cervical spine. 2. Mild chronic microvascular ischemic changes in the cerebral white matter, as above. Electronically Signed   By: Trudie Reed M.D.   On: 05/31/2022 09:12   CT Cervical Spine Wo Contrast  Result Date: 05/31/2022 CLINICAL DATA:  61 year old female history of trauma from a fall associated with a syncopal event. EXAM: CT HEAD WITHOUT CONTRAST CT CERVICAL SPINE WITHOUT CONTRAST TECHNIQUE: Multidetector CT imaging of the head and cervical spine was performed following the standard protocol without intravenous contrast. Multiplanar CT image reconstructions of the cervical spine were also generated.  RADIATION DOSE REDUCTION: This exam was performed according to the departmental dose-optimization program which includes automated exposure control, adjustment of the mA and/or kV according to patient size and/or use of iterative reconstruction technique. COMPARISON:  Head CT 12/27/2015. FINDINGS: CT HEAD FINDINGS Brain: Patchy areas of mild decreased attenuation are noted throughout the deep and periventricular white matter of the cerebral hemispheres bilaterally, compatible with mild chronic microvascular ischemic disease. No evidence of acute infarction, hemorrhage, hydrocephalus, extra-axial collection or mass lesion/mass effect. Vascular: No hyperdense vessel or unexpected calcification. Skull: Normal. Negative for fracture or focal lesion. Sinuses/Orbits: No acute finding. Other: None. CT CERVICAL SPINE FINDINGS Alignment: Normal. Skull base and vertebrae: No acute fracture. No primary bone lesion or focal pathologic process. Soft tissues and spinal canal: No prevertebral fluid or swelling. No visible canal hematoma. Disc levels: No significant degenerative disc disease or facet arthropathy. Upper chest: Unremarkable. Other: None. IMPRESSION: 1. No evidence of significant acute traumatic injury to the skull, brain or cervical spine. 2. Mild chronic microvascular ischemic changes in the cerebral white matter, as above. Electronically Signed   By: Trudie Reed M.D.   On: 05/31/2022 09:12    Procedures Procedures  {Document cardiac monitor, telemetry assessment procedure when appropriate:1}  Medications Ordered in ED Medications  lactated ringers bolus 1,000 mL (0  mLs Intravenous Stopped 05/31/22 1053)    ED Course/ Medical Decision Making/ A&P                           Medical Decision Making Amount and/or Complexity of Data Reviewed Labs: ordered. Radiology: ordered.   ***  {Document critical care time when appropriate:1} {Document review of labs and clinical decision tools ie heart  score, Chads2Vasc2 etc:1}  {Document your independent review of radiology images, and any outside records:1} {Document your discussion with family members, caretakers, and with consultants:1} {Document social determinants of health affecting pt's care:1} {Document your decision making why or why not admission, treatments were needed:1} Final Clinical Impression(s) / ED Diagnoses Final diagnoses:  None    Rx / DC Orders ED Discharge Orders     None

## 2022-05-31 NOTE — ED Triage Notes (Signed)
Pt bib ems from home; syncopal episode this am; c/o feeling lightheaded, one episode of emesis prior to syncope; pt states she went to get up to get some pain meds and passed out; no recent illnesses; received pneumonia vaccine yesterday; unwitnessed fall; not on thinners; unsure if hit head, no obvious injuries; c/o lethargy denies pain; 18 ga lac; answering questions appropriately with ems; 112/60, 72 HR, 100% RA, cbg 152, 12 lead unremarkable

## 2022-05-31 NOTE — ED Notes (Signed)
Patient verbalizes understanding of discharge instructions. Opportunity for questioning and answers were provided. Pt discharged from ED. 

## 2022-05-31 NOTE — Discharge Instructions (Addendum)
You were seen in the ED for evaluation of your syncopal events. Your lab work up and imaging did not show anything acute causing your symptoms. You did have some orthostatic hypotension, which is why we gave you IV fluids. I have included more information on orthostatic hypotension and syncope. Please make sure you alert your PCP about your visit today. If you have any problems with your speech, walking, weakness, chest pain, shortness of breath, palpitations, syncope, or near syncope, please return immediately to the emergency department.  Contact a doctor if: You have episodes of near fainting. Get help right away if: You pass out or faint. You hit your head or are injured after fainting. You have any of these symptoms: Fast or uneven heartbeats (palpitations). Pain in your chest, belly, or back. Shortness of breath. You have jerky movements that you cannot control (seizure). You have a very bad headache. You are confused. You have problems with how you see (vision). You are very weak. You have trouble walking. You are bleeding from your mouth or your butt (rectum). You have black or tarry poop (stool). These symptoms may be an emergency. Get help right away. Call your local emergency services (911 in the [place] Do not wait to see if the symptoms will go away. Do not drive yourself to the hospital.

## 2022-06-05 ENCOUNTER — Ambulatory Visit (INDEPENDENT_AMBULATORY_CARE_PROVIDER_SITE_OTHER): Payer: BC Managed Care – PPO

## 2022-06-05 DIAGNOSIS — J309 Allergic rhinitis, unspecified: Secondary | ICD-10-CM

## 2022-06-23 DIAGNOSIS — M9903 Segmental and somatic dysfunction of lumbar region: Secondary | ICD-10-CM | POA: Diagnosis not present

## 2022-06-23 DIAGNOSIS — M47816 Spondylosis without myelopathy or radiculopathy, lumbar region: Secondary | ICD-10-CM | POA: Diagnosis not present

## 2022-06-23 DIAGNOSIS — M5386 Other specified dorsopathies, lumbar region: Secondary | ICD-10-CM | POA: Diagnosis not present

## 2022-06-23 DIAGNOSIS — M9904 Segmental and somatic dysfunction of sacral region: Secondary | ICD-10-CM | POA: Diagnosis not present

## 2022-07-21 DIAGNOSIS — M5386 Other specified dorsopathies, lumbar region: Secondary | ICD-10-CM | POA: Diagnosis not present

## 2022-07-21 DIAGNOSIS — M9903 Segmental and somatic dysfunction of lumbar region: Secondary | ICD-10-CM | POA: Diagnosis not present

## 2022-07-21 DIAGNOSIS — M47816 Spondylosis without myelopathy or radiculopathy, lumbar region: Secondary | ICD-10-CM | POA: Diagnosis not present

## 2022-07-21 DIAGNOSIS — M9904 Segmental and somatic dysfunction of sacral region: Secondary | ICD-10-CM | POA: Diagnosis not present

## 2022-11-06 ENCOUNTER — Ambulatory Visit
Admission: RE | Admit: 2022-11-06 | Discharge: 2022-11-06 | Disposition: A | Discharge status: Home / Self Care | Payer: BC Managed Care – PPO | Source: Ambulatory Visit | Attending: Physician Assistant | Admitting: Physician Assistant

## 2022-11-06 ENCOUNTER — Other Ambulatory Visit: Payer: Self-pay | Admitting: Physician Assistant

## 2022-11-06 DIAGNOSIS — S0993XA Unspecified injury of face, initial encounter: Secondary | ICD-10-CM

## 2022-11-06 DIAGNOSIS — M542 Cervicalgia: Secondary | ICD-10-CM | POA: Diagnosis not present

## 2022-11-06 DIAGNOSIS — R55 Syncope and collapse: Secondary | ICD-10-CM | POA: Diagnosis not present

## 2022-11-06 DIAGNOSIS — R519 Headache, unspecified: Secondary | ICD-10-CM | POA: Diagnosis not present

## 2022-11-18 DIAGNOSIS — M5386 Other specified dorsopathies, lumbar region: Secondary | ICD-10-CM | POA: Diagnosis not present

## 2022-11-18 DIAGNOSIS — M9903 Segmental and somatic dysfunction of lumbar region: Secondary | ICD-10-CM | POA: Diagnosis not present

## 2022-11-18 DIAGNOSIS — M9902 Segmental and somatic dysfunction of thoracic region: Secondary | ICD-10-CM | POA: Diagnosis not present

## 2022-11-18 DIAGNOSIS — M47816 Spondylosis without myelopathy or radiculopathy, lumbar region: Secondary | ICD-10-CM | POA: Diagnosis not present

## 2022-11-18 DIAGNOSIS — M9905 Segmental and somatic dysfunction of pelvic region: Secondary | ICD-10-CM | POA: Diagnosis not present

## 2022-11-18 DIAGNOSIS — M9904 Segmental and somatic dysfunction of sacral region: Secondary | ICD-10-CM | POA: Diagnosis not present

## 2022-11-20 DIAGNOSIS — M9902 Segmental and somatic dysfunction of thoracic region: Secondary | ICD-10-CM | POA: Diagnosis not present

## 2022-11-20 DIAGNOSIS — M6240 Contracture of muscle, unspecified site: Secondary | ICD-10-CM | POA: Diagnosis not present

## 2022-11-20 DIAGNOSIS — M9901 Segmental and somatic dysfunction of cervical region: Secondary | ICD-10-CM | POA: Diagnosis not present

## 2022-11-27 DIAGNOSIS — M9902 Segmental and somatic dysfunction of thoracic region: Secondary | ICD-10-CM | POA: Diagnosis not present

## 2022-11-27 DIAGNOSIS — M6240 Contracture of muscle, unspecified site: Secondary | ICD-10-CM | POA: Diagnosis not present

## 2022-11-27 DIAGNOSIS — M9901 Segmental and somatic dysfunction of cervical region: Secondary | ICD-10-CM | POA: Diagnosis not present

## 2022-12-23 ENCOUNTER — Ambulatory Visit: Payer: BC Managed Care – PPO | Admitting: Physician Assistant

## 2023-02-26 DIAGNOSIS — Z01419 Encounter for gynecological examination (general) (routine) without abnormal findings: Secondary | ICD-10-CM | POA: Diagnosis not present

## 2023-02-26 DIAGNOSIS — Z1231 Encounter for screening mammogram for malignant neoplasm of breast: Secondary | ICD-10-CM | POA: Diagnosis not present

## 2023-02-26 DIAGNOSIS — Z1389 Encounter for screening for other disorder: Secondary | ICD-10-CM | POA: Diagnosis not present

## 2023-02-26 DIAGNOSIS — Z130 Encounter for screening for diseases of the blood and blood-forming organs and certain disorders involving the immune mechanism: Secondary | ICD-10-CM | POA: Diagnosis not present

## 2023-02-26 DIAGNOSIS — Z780 Asymptomatic menopausal state: Secondary | ICD-10-CM | POA: Diagnosis not present

## 2023-03-20 DIAGNOSIS — Z23 Encounter for immunization: Secondary | ICD-10-CM | POA: Diagnosis not present

## 2023-04-08 DIAGNOSIS — M9901 Segmental and somatic dysfunction of cervical region: Secondary | ICD-10-CM | POA: Diagnosis not present

## 2023-04-08 DIAGNOSIS — M9902 Segmental and somatic dysfunction of thoracic region: Secondary | ICD-10-CM | POA: Diagnosis not present

## 2023-04-08 DIAGNOSIS — M6240 Contracture of muscle, unspecified site: Secondary | ICD-10-CM | POA: Diagnosis not present

## 2023-04-13 DIAGNOSIS — M6240 Contracture of muscle, unspecified site: Secondary | ICD-10-CM | POA: Diagnosis not present

## 2023-04-13 DIAGNOSIS — M9901 Segmental and somatic dysfunction of cervical region: Secondary | ICD-10-CM | POA: Diagnosis not present

## 2023-04-13 DIAGNOSIS — M9902 Segmental and somatic dysfunction of thoracic region: Secondary | ICD-10-CM | POA: Diagnosis not present

## 2023-04-14 DIAGNOSIS — H524 Presbyopia: Secondary | ICD-10-CM | POA: Diagnosis not present

## 2023-04-20 DIAGNOSIS — M6240 Contracture of muscle, unspecified site: Secondary | ICD-10-CM | POA: Diagnosis not present

## 2023-04-20 DIAGNOSIS — M9902 Segmental and somatic dysfunction of thoracic region: Secondary | ICD-10-CM | POA: Diagnosis not present

## 2023-04-20 DIAGNOSIS — M9901 Segmental and somatic dysfunction of cervical region: Secondary | ICD-10-CM | POA: Diagnosis not present

## 2023-05-07 DIAGNOSIS — M9901 Segmental and somatic dysfunction of cervical region: Secondary | ICD-10-CM | POA: Diagnosis not present

## 2023-05-07 DIAGNOSIS — M6240 Contracture of muscle, unspecified site: Secondary | ICD-10-CM | POA: Diagnosis not present

## 2023-05-07 DIAGNOSIS — M9903 Segmental and somatic dysfunction of lumbar region: Secondary | ICD-10-CM | POA: Diagnosis not present

## 2023-05-07 DIAGNOSIS — M9902 Segmental and somatic dysfunction of thoracic region: Secondary | ICD-10-CM | POA: Diagnosis not present

## 2023-06-02 DIAGNOSIS — M6240 Contracture of muscle, unspecified site: Secondary | ICD-10-CM | POA: Diagnosis not present

## 2023-06-02 DIAGNOSIS — M9903 Segmental and somatic dysfunction of lumbar region: Secondary | ICD-10-CM | POA: Diagnosis not present

## 2023-06-02 DIAGNOSIS — M9902 Segmental and somatic dysfunction of thoracic region: Secondary | ICD-10-CM | POA: Diagnosis not present

## 2023-06-02 DIAGNOSIS — M9901 Segmental and somatic dysfunction of cervical region: Secondary | ICD-10-CM | POA: Diagnosis not present

## 2023-06-19 DIAGNOSIS — M8580 Other specified disorders of bone density and structure, unspecified site: Secondary | ICD-10-CM | POA: Diagnosis not present

## 2023-06-19 DIAGNOSIS — R031 Nonspecific low blood-pressure reading: Secondary | ICD-10-CM | POA: Diagnosis not present

## 2023-06-19 DIAGNOSIS — J4520 Mild intermittent asthma, uncomplicated: Secondary | ICD-10-CM | POA: Diagnosis not present

## 2023-06-19 DIAGNOSIS — E7849 Other hyperlipidemia: Secondary | ICD-10-CM | POA: Diagnosis not present

## 2023-06-19 DIAGNOSIS — R55 Syncope and collapse: Secondary | ICD-10-CM | POA: Diagnosis not present

## 2023-06-19 DIAGNOSIS — Z23 Encounter for immunization: Secondary | ICD-10-CM | POA: Diagnosis not present

## 2023-06-19 DIAGNOSIS — G43909 Migraine, unspecified, not intractable, without status migrainosus: Secondary | ICD-10-CM | POA: Diagnosis not present

## 2023-06-19 DIAGNOSIS — H35033 Hypertensive retinopathy, bilateral: Secondary | ICD-10-CM | POA: Diagnosis not present

## 2023-06-19 DIAGNOSIS — Z0000 Encounter for general adult medical examination without abnormal findings: Secondary | ICD-10-CM | POA: Diagnosis not present

## 2023-07-02 DIAGNOSIS — M9901 Segmental and somatic dysfunction of cervical region: Secondary | ICD-10-CM | POA: Diagnosis not present

## 2023-07-02 DIAGNOSIS — M6240 Contracture of muscle, unspecified site: Secondary | ICD-10-CM | POA: Diagnosis not present

## 2023-07-02 DIAGNOSIS — M9903 Segmental and somatic dysfunction of lumbar region: Secondary | ICD-10-CM | POA: Diagnosis not present

## 2023-07-02 DIAGNOSIS — M9902 Segmental and somatic dysfunction of thoracic region: Secondary | ICD-10-CM | POA: Diagnosis not present

## 2023-07-07 DIAGNOSIS — M8588 Other specified disorders of bone density and structure, other site: Secondary | ICD-10-CM | POA: Diagnosis not present

## 2023-07-07 DIAGNOSIS — R55 Syncope and collapse: Secondary | ICD-10-CM | POA: Diagnosis not present

## 2023-07-07 DIAGNOSIS — M8589 Other specified disorders of bone density and structure, multiple sites: Secondary | ICD-10-CM | POA: Diagnosis not present

## 2023-07-07 DIAGNOSIS — M85851 Other specified disorders of bone density and structure, right thigh: Secondary | ICD-10-CM | POA: Diagnosis not present

## 2023-07-09 DIAGNOSIS — R55 Syncope and collapse: Secondary | ICD-10-CM | POA: Diagnosis not present

## 2023-07-10 DIAGNOSIS — R55 Syncope and collapse: Secondary | ICD-10-CM | POA: Diagnosis not present

## 2023-08-10 DIAGNOSIS — M6240 Contracture of muscle, unspecified site: Secondary | ICD-10-CM | POA: Diagnosis not present

## 2023-08-10 DIAGNOSIS — M9902 Segmental and somatic dysfunction of thoracic region: Secondary | ICD-10-CM | POA: Diagnosis not present

## 2023-08-10 DIAGNOSIS — M9903 Segmental and somatic dysfunction of lumbar region: Secondary | ICD-10-CM | POA: Diagnosis not present

## 2023-08-10 DIAGNOSIS — M9901 Segmental and somatic dysfunction of cervical region: Secondary | ICD-10-CM | POA: Diagnosis not present

## 2023-09-08 DIAGNOSIS — M6240 Contracture of muscle, unspecified site: Secondary | ICD-10-CM | POA: Diagnosis not present

## 2023-09-08 DIAGNOSIS — M9901 Segmental and somatic dysfunction of cervical region: Secondary | ICD-10-CM | POA: Diagnosis not present

## 2023-09-08 DIAGNOSIS — M9903 Segmental and somatic dysfunction of lumbar region: Secondary | ICD-10-CM | POA: Diagnosis not present

## 2023-09-08 DIAGNOSIS — Z23 Encounter for immunization: Secondary | ICD-10-CM | POA: Diagnosis not present

## 2023-09-08 DIAGNOSIS — M9902 Segmental and somatic dysfunction of thoracic region: Secondary | ICD-10-CM | POA: Diagnosis not present

## 2023-10-06 DIAGNOSIS — M9901 Segmental and somatic dysfunction of cervical region: Secondary | ICD-10-CM | POA: Diagnosis not present

## 2023-10-06 DIAGNOSIS — M6240 Contracture of muscle, unspecified site: Secondary | ICD-10-CM | POA: Diagnosis not present

## 2023-10-06 DIAGNOSIS — M9903 Segmental and somatic dysfunction of lumbar region: Secondary | ICD-10-CM | POA: Diagnosis not present

## 2023-10-06 DIAGNOSIS — M9902 Segmental and somatic dysfunction of thoracic region: Secondary | ICD-10-CM | POA: Diagnosis not present

## 2023-10-14 ENCOUNTER — Encounter: Payer: Self-pay | Admitting: Cardiology

## 2023-10-14 ENCOUNTER — Ambulatory Visit: Payer: BC Managed Care – PPO | Attending: Cardiology | Admitting: Cardiology

## 2023-10-14 VITALS — HR 72 | Ht 71.0 in | Wt 129.8 lb

## 2023-10-14 DIAGNOSIS — R55 Syncope and collapse: Secondary | ICD-10-CM | POA: Diagnosis not present

## 2023-10-14 DIAGNOSIS — I471 Supraventricular tachycardia, unspecified: Secondary | ICD-10-CM | POA: Diagnosis not present

## 2023-10-14 NOTE — Progress Notes (Signed)
Cardiology CONSULT Note    Date:  10/14/2023   ID:  Renee Hansen, DOB Jan 14, 1961, MRN [id number]  PCP:  Maurice Small, MD (Inactive)  Cardiologist:  Armanda Magic, MD   Chief Complaint  Patient presents with   New Patient (Initial Visit)    Syncope/SVT     Patient Profile: Renee Hansen is a 62 y.o. female who is being seen today for the evaluation of syncope/SVT at the request of Hughie Closs, MD.  History of Present Illness:  Renee Hansen is a 62 y.o. female who is being seen today for the evaluation of syncope/SVT at the request of Hughie Closs, MD.   This is a 62 year old female with a history of asthma and hypertension with hypertensive retinopathy who presents for evaluation of syncope.  She apparently has had several episodes of syncope.  She had 2 in 2023 and was evaluated in the emergency room in July 2023 with normal workup.  She had another episode December 2023 suffering facial injury.  Her BP at home has been running at that time in the 90s over 50s.  She wore a Zio patch which showed nonsustained atrial tachycardia up to 12 beats in a row.  There were no bradycardia arrhythmias.  2D echo done 07/10/2023 showed EF 65 to 70% with trivial AI, mild AVSC, mild MR, trivial TR.  She tells me that she has only had 2 episodes of syncope.  The first one occurred after she got her PNA vaccine and thinks she was dehydrated.  She had gotten up to get some tylenol due to a HA and passed out in the kitchen.  She tried to get up and passed out again and then a 3rd time.  She went to the ER and was dx with dehydration and was given fluids.  She then had another episode last December in the setting of HAs and stiff neck and got up from bed and went to the bathroom and urinated and stood up and passed out. She has not had any other episodes. Recently she has been experiencing low BP at times with dizziness.    She denies any chest pain or pressure, SOB, DOE, PND, orthopnea, LE  edema, dizziness, or syncope. She occasionally will feel her heart beat fast for a brief period but never has had it in the setting of passing out.  These are very sporadic an rare in occurrence. She is compliant with her meds and is tolerating meds with no SE.    Past Medical History:  Diagnosis Date   Allergic rhinitis due to pollen    Asthma    Colon polyp    Herpes labialis    Herpesviral infection    Hypertensive retinopathy of both eyes    Migraine    Osteopenia    Unspecified mood (affective) disorder (HCC)     History reviewed. No pertinent surgical history.  Current Medications: Current Meds  Medication Sig   albuterol (PROVENTIL HFA;VENTOLIN HFA) 108 (90 Base) MCG/ACT inhaler Inhale 2 puffs into the lungs every 6 (six) hours as needed for wheezing or shortness of breath.   Apoaequorin (PREVAGEN EXTRA STRENGTH) 20 MG CAPS Take 20 mg by mouth daily.   Biotin 1000 MCG CHEW biotin   cholecalciferol (VITAMIN D) 1000 UNITS tablet Take 1,000 Units by mouth daily.   fish oil-omega-3 fatty acids 1000 MG capsule Take 1 g by mouth daily.   Magnesium 500 MG CAPS Take 500 mg by mouth daily.  Multiple Vitamin (M.V.I. ADULT IV) See admin instructions.   Multiple Vitamin (MULTIVITAMIN WITH MINERALS) TABS tablet Take 1 tablet by mouth daily.   UNABLE TO FIND Allergy Injections   valACYclovir (VALTREX) 500 MG tablet TAKE 1 TABLET BY MOUTH TWICE DAILY FOR 5 DAYS AS NEEDED FOR OUTBREAK   vitamin C (ASCORBIC ACID) 500 MG tablet Take 500 mg by mouth daily.   vitamin E 400 UNIT capsule Take 400 Units by mouth daily.    Allergies:   Chlorthalidone and Spironolactone   Social History   Socioeconomic History   Marital status: Married    Spouse name: Not on file   Number of children: Not on file   Years of education: Not on file   Highest education level: Not on file  Occupational History   Not on file  Tobacco Use   Smoking status: Never   Smokeless tobacco: Never  Vaping Use    Vaping status: Never Used  Substance and Sexual Activity   Alcohol use: No   Drug use: No   Sexual activity: Not on file  Other Topics Concern   Not on file  Social History Narrative   Not on file   Social Determinants of Health   Financial Resource Strain: Not on file  Food Insecurity: Not on file  Transportation Needs: Not on file  Physical Activity: Not on file  Stress: Not on file  Social Connections: Unknown (04/11/2022)   Received [facility]   Social Network    Social Network: Not on file     Family History:  The patient's family history is not on file.   ROS:   Please see the history of present illness.    ROS All other systems reviewed and are negative.      No data to display             PHYSICAL EXAM:   VS:  Pulse 72   Ht 5\' 11"  (1.803 m)   Wt 129 lb 12.8 oz (58.9 kg)   SpO2 99%   BMI 18.10 kg/m     Orthostatic VS for the past 24 hrs (Last 3 readings):  BP- Lying Pulse- Lying BP- Sitting Pulse- Sitting BP- Standing at 0 minutes Pulse- Standing at 0 minutes BP- Standing at 3 minutes Pulse- Standing at 3 minutes  10/14/23 0904 141/87 67 148/88 71 126/80 78 118/80 84    GEN: Well nourished, well developed, in no acute distress  HEENT: normal  Neck: no JVD, carotid bruits, or masses Cardiac: RRR; no murmurs, rubs, or gallops,no edema.  Intact distal pulses bilaterally.  Respiratory:  clear to auscultation bilaterally, normal work of breathing GI: soft, nontender, nondistended, + BS MS: no deformity or atrophy  Skin: warm and dry, no rash Neuro:  Alert and Oriented x 3, Strength and sensation are intact Psych: euthymic mood, full affect  Wt Readings from Last 3 Encounters:  10/14/23 129 lb 12.8 oz (58.9 kg)  05/31/22 124 lb (56.2 kg)  04/03/22 128 lb 12.8 oz (58.4 kg)      Studies/Labs Reviewed:   EKG Interpretation Date/Time:  Wednesday October 14 2023 08:58:19 EST Ventricular Rate:  71 PR Interval:  140 QRS  Duration:  80 QT Interval:  404 QTC Calculation: 439 R Axis:   93  Text Interpretation: Normal sinus rhythm Rightward axis When compared with ECG of 31-May-2022 08:16, PREVIOUS ECG IS PRESENT Confirmed by Armanda Magic ([id number]) on 10/14/2023 9:15:23 AMEKG Interpretation Date/Time:  Wednesday October 14 2023 08:58:19 EST Ventricular Rate:  71 PR Interval:  140 QRS Duration:  80 QT Interval:  404 QTC Calculation: 439 R Axis:   93  Text Interpretation: Normal sinus rhythm Rightward axis When compared with ECG of 31-May-2022 08:16, PREVIOUS ECG IS PRESENT Confirmed by Armanda Magic ([id number]) on 10/14/2023 9:15:23 AM        Recent Labs: No results found for requested labs within last 365 days.   Lipid Panel No results found for: "CHOL", "TRIG", "HDL", "CHOLHDL", "VLDL", "LDLCALC", "LDLDIRECT"   ASSESSMENT:    1. Syncope and collapse   2. Supraventricular tachycardia (HCC)      PLAN:  In order of problems listed above:  #Syncope -Unclear etiology but suspect orthostatic hypotension -Orthostatic blood pressures in the office today showed a drop in her blood pressure when going from sitting to standing of greater than 20 mmHg but still was in the normal range of 120 mmHg systolic. -2-week ZIO patch showed nonsustained atrial tachycardia up to 12 beats in a row but no bradycardia arrhythmias or posttermination pauses -2D showed normal LV function with no significant valvular heart disease -I have encouraged her to wear compression hose when she is up during the day and to liberalize salt intake and avoid caffeine.  Encouraged her to try drink 64-100oz of fluid daily -Encouraged her to get into an aerobic exercise program. -labs from PCP were normal -check am cortisol level -if she continues to have dizziness and orthostasis then may need to consider addition of florinef  #SVT -Ziopatch showed short bursts of SVT up to 12 beats with no post termination pauses -she has no  complaints of palpitations so no treatment indicated at this time  Time Spent: 20 minutes total time of encounter, including 15 minutes spent in face-to-face patient care on the date of this encounter. This time includes coordination of care and counseling regarding above mentioned problem list. Remainder of non-face-to-face time involved reviewing chart documents/testing relevant to the patient encounter and documentation in the medical record. I have independently reviewed documentation from referring provider  Followup:  4 weeks with PA  Medication Adjustments/Labs and Tests Ordered: Current medicines are reviewed at length with the patient today.  Concerns regarding medicines are outlined above.  Medication changes, Labs and Tests ordered today are listed in the Patient Instructions below.  There are no Patient Instructions on file for this visit.   Signed, Armanda Magic, MD  10/14/2023 9:24 AM    [facility] [address] [place], [place], [place]  [address] Phone: [phone]; Fax: [phone]

## 2023-10-14 NOTE — Patient Instructions (Signed)
Medication Instructions:  Your physician recommends that you continue on your current medications as directed. Please refer to the Current Medication list given to you today.  *If you need a refill on your cardiac medications before your next appointment, please call your pharmacy*   Lab Work: Please make an appointment to complete an 8 AM cortisol level with [facility].  If you have labs (blood work) drawn today and your tests are completely normal, you will receive your results only by: MyChart Message (if you have MyChart) OR A paper copy in the mail If you have any lab test that is abnormal or we need to change your treatment, we will call you to review the results.   Testing/Procedures: None.   Follow-Up:   Your next appointment:   4 week(s)  Provider:   Eligha Bridegroom, NP or Jari Favre PA-C     Other Instructions Avoid caffeine and alcohol. Try to consume between 64-100 ounces of hydrating fluid a day. Wear TED hose for 12 hours at a time, taking them off at night.

## 2023-10-14 NOTE — Addendum Note (Signed)
Addended by: Luellen Pucker on: 10/14/2023 09:46 AM   Modules accepted: Orders

## 2023-10-16 DIAGNOSIS — R55 Syncope and collapse: Secondary | ICD-10-CM | POA: Diagnosis not present

## 2023-10-19 ENCOUNTER — Telehealth: Payer: Self-pay

## 2023-10-19 LAB — CORTISOL-AM, BLOOD: Cortisol - AM: 13.8 ug/dL (ref 6.2–19.4)

## 2023-10-19 NOTE — Telephone Encounter (Signed)
-----   Message from Armanda Magic sent at 10/19/2023 12:39 PM EST ----- Please let patient know that labs were normal.  Continue current medical therapy.

## 2023-10-19 NOTE — Telephone Encounter (Signed)
Patient verbalizes understanding to continue current medications due to normal labs.

## 2023-11-03 DIAGNOSIS — M9901 Segmental and somatic dysfunction of cervical region: Secondary | ICD-10-CM | POA: Diagnosis not present

## 2023-11-03 DIAGNOSIS — M9903 Segmental and somatic dysfunction of lumbar region: Secondary | ICD-10-CM | POA: Diagnosis not present

## 2023-11-03 DIAGNOSIS — M9902 Segmental and somatic dysfunction of thoracic region: Secondary | ICD-10-CM | POA: Diagnosis not present

## 2023-11-03 DIAGNOSIS — M6240 Contracture of muscle, unspecified site: Secondary | ICD-10-CM | POA: Diagnosis not present

## 2023-11-19 ENCOUNTER — Ambulatory Visit: Payer: BC Managed Care – PPO | Admitting: Physician Assistant

## 2023-11-19 ENCOUNTER — Encounter: Payer: Self-pay | Admitting: Cardiology

## 2023-11-19 ENCOUNTER — Ambulatory Visit: Payer: BC Managed Care – PPO | Attending: Cardiology | Admitting: Cardiology

## 2023-11-19 VITALS — BP 117/75 | HR 76 | Ht 71.0 in | Wt 129.0 lb

## 2023-11-19 DIAGNOSIS — R55 Syncope and collapse: Secondary | ICD-10-CM

## 2023-11-19 DIAGNOSIS — I471 Supraventricular tachycardia, unspecified: Secondary | ICD-10-CM

## 2023-11-19 NOTE — Patient Instructions (Signed)

## 2023-11-19 NOTE — Progress Notes (Signed)
Cardiology  Note    Date:  11/19/2023   ID:  Renee Hansen, DOB [birth year], MRN [id number]  PCP:  Ollen Bowl, MD  Cardiologist:  Armanda Magic, MD   Chief Complaint  Patient presents with   Follow-up    Syncope and SVT     History of Present Illness:  Renee Hansen is a 62 y.o. female with a history of asthma and hypertension with hypertensive retinopathy who initially saw in consultation for evaluation of syncope.  She apparently has had several episodes of syncope.  She had 2 in 2023 and was evaluated in the emergency room in July 2023 with normal workup.  She had another episode December 2023 suffering facial injury.  Her BP at home has been running at that time in the 90s over 50s.  She wore a Zio patch which showed nonsustained atrial tachycardia up to 12 beats in a row.  There were no bradycardia arrhythmias.  2D echo done 07/10/2023 showed EF 65 to 70% with trivial AI, mild AVSC, mild MR, trivial TR.  During her initial evaluation she told me that her first one occurred after she got her PNA vaccine and thinks she was dehydrated.  She had gotten up to get some tylenol due to a HA and passed out in the kitchen.  She tried to get up and passed out again and then a 3rd time.  She went to the ER and was dx with dehydration and was given fluids.  She then had another episode last December in the setting of HAs and stiff neck and got up from bed and went to the bathroom and urinated and stood up and passed out.   At her last office visit with me she was diagnosed with orthostatic hypotension although blood pressure still in the normal range after dropping 20 mm.  With normal 2D echo and no significant bradycardia arrhythmias on heart monitor was felt likely that her episodes of syncope were related to dehydration.  We encouraged her to liberalize her salt intake and avoid caffeine.  Also encouraged her to drink at least 64 to 100 ounces of fluid daily.  Also encouraged her to get  into an aerobic exercise program. A.m. cortisol level was normal at 13.8.  She is now back for follow-up today and has been doing much better. She is drinking at least 64oz of fluid daily and wearing compression hose.  She has been exercising. She denies any dizziness or syncope.  No CP, SOB, LE edema. She notices her heart beat harder when she is up in the am getting dressed but resolves on its own.    Past Medical History:  Diagnosis Date   Allergic rhinitis due to pollen    Asthma    Colon polyp    Herpes labialis    Herpesviral infection    Hypertensive retinopathy of both eyes    Migraine    Orthostatic hypotension    Osteopenia    Unspecified mood (affective) disorder (HCC)     No past surgical history on file.  Current Medications: Current Meds  Medication Sig   albuterol (PROVENTIL HFA;VENTOLIN HFA) 108 (90 Base) MCG/ACT inhaler Inhale 2 puffs into the lungs every 6 (six) hours as needed for wheezing or shortness of breath.   Apoaequorin (PREVAGEN EXTRA STRENGTH) 20 MG CAPS Take 20 mg by mouth daily.   Biotin 1000 MCG CHEW biotin   cholecalciferol (VITAMIN D) 1000 UNITS tablet Take 1,000 Units by  mouth daily.   fish oil-omega-3 fatty acids 1000 MG capsule Take 1 g by mouth daily.   Magnesium 500 MG CAPS Take 500 mg by mouth daily.   Multiple Vitamin (M.V.I. ADULT IV) See admin instructions.   Multiple Vitamin (MULTIVITAMIN WITH MINERALS) TABS tablet Take 1 tablet by mouth daily.   UNABLE TO FIND Allergy Injections   valACYclovir (VALTREX) 500 MG tablet TAKE 1 TABLET BY MOUTH TWICE DAILY FOR 5 DAYS AS NEEDED FOR OUTBREAK   vitamin C (ASCORBIC ACID) 500 MG tablet Take 500 mg by mouth daily.   vitamin E 400 UNIT capsule Take 400 Units by mouth daily.    Allergies:   Chlorthalidone and Spironolactone   Social History   Socioeconomic History   Marital status: Married    Spouse name: Not on file   Number of children: Not on file   Years of education: Not on file    Highest education level: Not on file  Occupational History   Not on file  Tobacco Use   Smoking status: Never   Smokeless tobacco: Never  Vaping Use   Vaping status: Never Used  Substance and Sexual Activity   Alcohol use: No   Drug use: No   Sexual activity: Not on file  Other Topics Concern   Not on file  Social History Narrative   Not on file   Social Drivers of Health   Financial Resource Strain: Not on file  Food Insecurity: Not on file  Transportation Needs: Not on file  Physical Activity: Not on file  Stress: Not on file  Social Connections: Unknown (04/11/2022)   Received from [facility]   Social Network    Social Network: Not on file     Family History:  The patient's family history is not on file.   ROS:   Please see the history of present illness.    ROS All other systems reviewed and are negative.      No data to display             PHYSICAL EXAM:   VS:  BP 117/75 (BP Location: Left Arm)   Pulse 76   Ht 5\' 11"  (1.803 m)   Wt 129 lb (58.5 kg)   SpO2 98%   BMI 17.99 kg/m     Orthostatic VS for the past 24 hrs (Last 3 readings):  BP- Lying Pulse- Lying BP- Sitting Pulse- Sitting BP- Standing at 0 minutes Pulse- Standing at 0 minutes BP- Standing at 3 minutes Pulse- Standing at 3 minutes  11/19/23 1351 134/77 73 123/73 72 123/70 81 130/80 85     GEN: Well nourished, well developed in no acute distress HEENT: Normal NECK: No JVD; No carotid bruits LYMPHATICS: No lymphadenopathy CARDIAC:RRR, no murmurs, rubs, gallops RESPIRATORY:  Clear to auscultation without rales, wheezing or rhonchi  ABDOMEN: Soft, non-tender, non-distended MUSCULOSKELETAL:  No edema; No deformity  SKIN: Warm and dry NEUROLOGIC:  Alert and oriented x 3 PSYCHIATRIC:  Normal affect  Wt Readings from Last 3 Encounters:  11/19/23 129 lb (58.5 kg)  10/14/23 129 lb 12.8 oz (58.9 kg)  05/31/22 124 lb (56.2 kg)      Studies/Labs Reviewed:              Recent Labs: No results found for requested labs within last 365 days.   Lipid Panel No results found for: "CHOL", "TRIG", "HDL", "CHOLHDL", "VLDL", "LDLCALC", "LDLDIRECT"   ASSESSMENT:    1. Syncope and collapse  2. Supraventricular tachycardia (HCC)       PLAN:  In order of problems listed above:  #Syncope -Unclear etiology but suspect orthostatic hypotension -Orthostatic blood pressures on initial evaluation showed a drop in her blood pressure when going from sitting to standing of greater than 20 mmHg but still was in the normal range of 120 mmHg systolic. -2-week ZIO patch showed nonsustained atrial tachycardia up to 12 beats in a row but no bradycardia arrhythmias or posttermination pauses -2D showed normal LV function with no significant valvular heart disease -At last office visit she was encouraged to wear compression hose when she is up during the day and to liberalize salt intake and avoid caffeine.  Encouraged her to try drink 64-100oz of fluid daily -orthostatic BPs today in the office were normal -Encouraged her to get into an aerobic exercise program. -labs from PCP were normal -A.m. cortisol level was normal at 13.8  #SVT -Ziopatch showed short bursts of SVT up to 12 beats with no post termination pauses -She denies any history of complaints of palpitations  Time Spent: 15 minutes total time of encounter, including 10 minutes spent in face-to-face patient care on the date of this encounter. This time includes coordination of care and counseling regarding above mentioned problem list. Remainder of non-face-to-face time involved reviewing chart documents/testing relevant to the patient encounter and documentation in the medical record. I have independently reviewed documentation from referring provider  Followup: 6 months  Medication Adjustments/Labs and Tests Ordered: Current medicines are reviewed at length with the patient today.  Concerns regarding  medicines are outlined above.  Medication changes, Labs and Tests ordered today are listed in the Patient Instructions below.  There are no Patient Instructions on file for this visit.   Signed, Armanda Magic, MD  11/19/2023 2:02 PM    [facility] [address] [place], [place], [place]  [address] Phone: [phone]; Fax: [phone]

## 2023-12-14 DIAGNOSIS — M6240 Contracture of muscle, unspecified site: Secondary | ICD-10-CM | POA: Diagnosis not present

## 2023-12-14 DIAGNOSIS — M9901 Segmental and somatic dysfunction of cervical region: Secondary | ICD-10-CM | POA: Diagnosis not present

## 2023-12-14 DIAGNOSIS — M9902 Segmental and somatic dysfunction of thoracic region: Secondary | ICD-10-CM | POA: Diagnosis not present

## 2023-12-14 DIAGNOSIS — M9903 Segmental and somatic dysfunction of lumbar region: Secondary | ICD-10-CM | POA: Diagnosis not present

## 2023-12-22 DIAGNOSIS — Z681 Body mass index (BMI) 19 or less, adult: Secondary | ICD-10-CM | POA: Diagnosis not present

## 2023-12-22 DIAGNOSIS — G43909 Migraine, unspecified, not intractable, without status migrainosus: Secondary | ICD-10-CM | POA: Diagnosis not present

## 2023-12-22 DIAGNOSIS — H35033 Hypertensive retinopathy, bilateral: Secondary | ICD-10-CM | POA: Diagnosis not present

## 2023-12-22 DIAGNOSIS — E7849 Other hyperlipidemia: Secondary | ICD-10-CM | POA: Diagnosis not present

## 2023-12-22 DIAGNOSIS — J4520 Mild intermittent asthma, uncomplicated: Secondary | ICD-10-CM | POA: Diagnosis not present

## 2023-12-22 DIAGNOSIS — I471 Supraventricular tachycardia, unspecified: Secondary | ICD-10-CM | POA: Diagnosis not present

## 2024-01-11 DIAGNOSIS — M9902 Segmental and somatic dysfunction of thoracic region: Secondary | ICD-10-CM | POA: Diagnosis not present

## 2024-01-11 DIAGNOSIS — M9901 Segmental and somatic dysfunction of cervical region: Secondary | ICD-10-CM | POA: Diagnosis not present

## 2024-01-11 DIAGNOSIS — M6240 Contracture of muscle, unspecified site: Secondary | ICD-10-CM | POA: Diagnosis not present

## 2024-01-11 DIAGNOSIS — M9903 Segmental and somatic dysfunction of lumbar region: Secondary | ICD-10-CM | POA: Diagnosis not present

## 2024-02-02 ENCOUNTER — Telehealth: Payer: Self-pay

## 2024-02-02 NOTE — Telephone Encounter (Signed)
   Pre-operative Risk Assessment    Patient Name: Renee Hansen  DOB: 11/07/1961 MRN: [id number]   Date of last office visit: 11/19/23 Kym Groom, MD Date of next office visit: NA   Request for Surgical Clearance    Procedure:   Colonoscopy  Date of Surgery:  Clearance TBD                                 Surgeon:  Dr. Charlott Rakes Surgeon's Group or Practice Name:  [facility] Phone number:  [phone] Fax number:  [phone]   Type of Clearance Requested:   - Medical    Type of Anesthesia:   Propofol   Additional requests/questions:    Elyse Jarvis   02/02/2024, 5:34 PM

## 2024-02-03 ENCOUNTER — Telehealth: Payer: Self-pay

## 2024-02-03 NOTE — Telephone Encounter (Signed)
 Patient has been scheduled med rec and consent done     Patient Consent for Virtual Visit         Renee Hansen has provided verbal consent on 02/03/2024 for a virtual visit (video or telephone).   CONSENT FOR VIRTUAL VISIT FOR:  Renee Hansen  By participating in this virtual visit I agree to the following:  I hereby voluntarily request, consent and authorize [facility] and its employed or contracted physicians, physician assistants, nurse practitioners or other licensed health care professionals (the Practitioner), to provide me with telemedicine health care services (the "Services") as deemed necessary by the treating Practitioner. I acknowledge and consent to receive the Services by the Practitioner via telemedicine. I understand that the telemedicine visit will involve communicating with the Practitioner through live audiovisual communication technology and the disclosure of certain medical information by electronic transmission. I acknowledge that I have been given the opportunity to request an in-person assessment or other available alternative prior to the telemedicine visit and am voluntarily participating in the telemedicine visit.  I understand that I have the right to withhold or withdraw my consent to the use of telemedicine in the course of my care at any time, without affecting my right to future care or treatment, and that the Practitioner or I may terminate the telemedicine visit at any time. I understand that I have the right to inspect all information obtained and/or recorded in the course of the telemedicine visit and may receive copies of available information for a reasonable fee.  I understand that some of the potential risks of receiving the Services via telemedicine include:  Delay or interruption in medical evaluation due to technological equipment failure or disruption; Information transmitted may not be sufficient (e.g. poor resolution of images) to allow  for appropriate medical decision making by the Practitioner; and/or  In rare instances, security protocols could fail, causing a breach of personal health information.  Furthermore, I acknowledge that it is my responsibility to provide information about my medical history, conditions and care that is complete and accurate to the best of my ability. I acknowledge that Practitioner's advice, recommendations, and/or decision may be based on factors not within their control, such as incomplete or inaccurate data provided by me or distortions of diagnostic images or specimens that may result from electronic transmissions. I understand that the practice of medicine is not an exact science and that Practitioner makes no warranties or guarantees regarding treatment outcomes. I acknowledge that a copy of this consent can be made available to me via my patient portal [facility] MyChart), or I can request a printed copy by calling the office of  [facility].    I understand that my insurance will be billed for this visit.   I have read or had this consent read to me. I understand the contents of this consent, which adequately explains the benefits and risks of the Services being provided via telemedicine.  I have been provided ample opportunity to ask questions regarding this consent and the Services and have had my questions answered to my satisfaction. I give my informed consent for the services to be provided through the use of telemedicine in my medical care

## 2024-02-03 NOTE — Telephone Encounter (Signed)
 Patient has been scheduled for telephone appointment

## 2024-02-03 NOTE — Telephone Encounter (Signed)
   Name: Renee Hansen  DOB: [birth year]  MRN: [id number]  Primary Cardiologist: None   Preoperative team, please contact this patient and set up a phone call appointment for further preoperative risk assessment. Please obtain consent and complete medication review. Thank you for your help.  I confirm that guidance regarding antiplatelet and oral anticoagulation therapy has been completed and, if necessary, noted below.    I also confirmed the patient resides in the state of [place]. As per [facility] Medical Board telemedicine laws, the patient must reside in the state in which the provider is licensed.   Napoleon Form, Leodis Rains, NP 02/03/2024, 8:19 AM [facility]

## 2024-02-03 NOTE — Telephone Encounter (Signed)
   Name: Renee Hansen  DOB: [birth year]  MRN: [id number]  Primary Cardiologist: None   Preoperative team, please contact this patient and set up a phone call appointment for further preoperative risk assessment. Please obtain consent and complete medication review. Thank you for your help.  I confirm that guidance regarding antiplatelet and oral anticoagulation therapy has been completed and, if necessary, noted below.  None  I also confirmed the patient resides in the state of [place]. As per [facility] Medical Board telemedicine laws, the patient must reside in the state in which the provider is licensed.   Napoleon Form, Leodis Rains, NP 02/03/2024, 8:23 AM [facility]

## 2024-02-08 DIAGNOSIS — M9902 Segmental and somatic dysfunction of thoracic region: Secondary | ICD-10-CM | POA: Diagnosis not present

## 2024-02-08 DIAGNOSIS — M6240 Contracture of muscle, unspecified site: Secondary | ICD-10-CM | POA: Diagnosis not present

## 2024-02-08 DIAGNOSIS — M9901 Segmental and somatic dysfunction of cervical region: Secondary | ICD-10-CM | POA: Diagnosis not present

## 2024-02-08 DIAGNOSIS — M9903 Segmental and somatic dysfunction of lumbar region: Secondary | ICD-10-CM | POA: Diagnosis not present

## 2024-02-12 ENCOUNTER — Ambulatory Visit: Attending: Cardiovascular Disease

## 2024-02-12 DIAGNOSIS — Z01810 Encounter for preprocedural cardiovascular examination: Secondary | ICD-10-CM | POA: Diagnosis not present

## 2024-02-12 NOTE — Progress Notes (Signed)
 Virtual Visit via Telephone Note   Because of EMMAKATE HYPES co-morbid illnesses, she is at least at moderate risk for complications without adequate follow up.  This format is felt to be most appropriate for this patient at this time.  Due to technical limitations with video connection Web designer), today's appointment will be conducted as an audio only telehealth visit, and Renee Hansen verbally agreed to proceed in this manner.   All issues noted in this document were discussed and addressed.  No physical exam could be performed with this format.  Evaluation Performed:  Preoperative cardiovascular risk assessment _____________   Date:  02/12/2024   Patient ID:  Renee Hansen, DOB 1961/05/07, MRN [id number] Patient Location:  Home Provider location:   Office  Primary Care Provider:  Ollen Bowl, MD Primary Cardiologist:  None  Chief Complaint / Patient Profile   63 y.o. y/o female with a h/o asthma, hyponatremia, hypokalemia, vertigo, SVT who is pending colonoscopy and presents today for telephonic preoperative cardiovascular risk assessment.  History of Present Illness    Renee Hansen is a 63 y.o. female who presents via audio/video conferencing for a telehealth visit today.  Pt was last seen in cardiology clinic on 11/19/2023 by Dr. Mayford Knife.  At that time Renee Hansen was doing well .  The patient is now pending procedure as outlined above. Since her last visit, she continues to be stable from a cardiac standpoint.  Today she denies chest pain, shortness of breath, lower extremity edema, fatigue, palpitations, melena, hematuria, hemoptysis, diaphoresis, weakness, presyncope, syncope, orthopnea, and PND.   Past Medical History    Past Medical History:  Diagnosis Date   Allergic rhinitis due to pollen    Asthma    Colon polyp    Herpes labialis    Herpesviral infection    Hypertensive retinopathy of both eyes    Migraine    Orthostatic hypotension     Osteopenia    Unspecified mood (affective) disorder (HCC)    No past surgical history on file.  Allergies  Allergies  Allergen Reactions   Chlorthalidone Other (See Comments)    Potassium drop, hospitalized    Spironolactone Other (See Comments)    Home Medications    Prior to Admission medications   Medication Sig Start Date End Date Taking? Authorizing Provider  albuterol (PROVENTIL HFA;VENTOLIN HFA) 108 (90 Base) MCG/ACT inhaler Inhale 2 puffs into the lungs every 6 (six) hours as needed for wheezing or shortness of breath. 07/05/18   Marcelyn Bruins, MD  Apoaequorin (PREVAGEN EXTRA STRENGTH) 20 MG CAPS Take 20 mg by mouth daily.    [provider]  Biotin 1000 MCG CHEW biotin    [provider]  cholecalciferol (VITAMIN D) 1000 UNITS tablet Take 1,000 Units by mouth daily.    [provider]  fish oil-omega-3 fatty acids 1000 MG capsule Take 1 g by mouth daily.    [provider]  Magnesium 500 MG CAPS Take 500 mg by mouth daily.    [provider]  Multiple Vitamin (M.V.I. ADULT IV) See admin instructions.    [provider]  Multiple Vitamin (MULTIVITAMIN WITH MINERALS) TABS tablet Take 1 tablet by mouth daily.    [provider]  UNABLE TO FIND Allergy Injections    [provider]  valACYclovir (VALTREX) 500 MG tablet TAKE 1 TABLET BY MOUTH TWICE DAILY FOR 5 DAYS AS NEEDED FOR OUTBREAK 06/18/18   [provider]  vitamin  C (ASCORBIC ACID) 500 MG tablet Take 500 mg by mouth daily.    [provider]  vitamin E 400 UNIT capsule Take 400 Units by mouth daily.    [provider]    Physical Exam    Vital Signs:  Renee Hansen does not have vital signs available for review today.  Given telephonic nature of communication, physical exam is limited. AAOx3. NAD. Normal affect.  Speech and respirations are unlabored.  Accessory Clinical Findings    None  Assessment &  Plan    1.  Preoperative Cardiovascular Risk Assessment:Procedure:   Colonoscopy   Date of Surgery:  Clearance TBD                                  Surgeon:  Dr. Charlott Rakes Surgeon's Group or Practice Name:  [facility] Phone number:  [phone] Fax number:  [phone]      Primary Cardiologist: Dr. Mayford Knife  Chart reviewed as part of pre-operative protocol coverage. Given past medical history and time since last visit, based on ACC/AHA guidelines, Renee Hansen would be at acceptable risk for the planned procedure without further cardiovascular testing.   Patient was advised that if she develops new symptoms prior to surgery to contact our office to arrange a follow-up appointment.  He verbalized understanding.   I will route this recommendation to the requesting party via Epic fax function and remove from pre-op pool.       Time:   Today, I have spent 5 minutes with the patient with telehealth technology discussing medical history, symptoms, and management plan.  I spent 10 minutes reviewing her past medical history, cardiac medications and cardiac test.   Ronney Asters, NP  02/12/2024, 7:19 AM

## 2024-03-07 DIAGNOSIS — M9901 Segmental and somatic dysfunction of cervical region: Secondary | ICD-10-CM | POA: Diagnosis not present

## 2024-03-07 DIAGNOSIS — M6240 Contracture of muscle, unspecified site: Secondary | ICD-10-CM | POA: Diagnosis not present

## 2024-03-07 DIAGNOSIS — M9902 Segmental and somatic dysfunction of thoracic region: Secondary | ICD-10-CM | POA: Diagnosis not present

## 2024-03-07 DIAGNOSIS — M9903 Segmental and somatic dysfunction of lumbar region: Secondary | ICD-10-CM | POA: Diagnosis not present

## 2024-03-16 DIAGNOSIS — Z01419 Encounter for gynecological examination (general) (routine) without abnormal findings: Secondary | ICD-10-CM | POA: Diagnosis not present

## 2024-03-16 DIAGNOSIS — Z1231 Encounter for screening mammogram for malignant neoplasm of breast: Secondary | ICD-10-CM | POA: Diagnosis not present

## 2024-03-16 DIAGNOSIS — Z130 Encounter for screening for diseases of the blood and blood-forming organs and certain disorders involving the immune mechanism: Secondary | ICD-10-CM | POA: Diagnosis not present

## 2024-04-01 DIAGNOSIS — Z860101 Personal history of adenomatous and serrated colon polyps: Secondary | ICD-10-CM | POA: Diagnosis not present

## 2024-04-01 DIAGNOSIS — Z09 Encounter for follow-up examination after completed treatment for conditions other than malignant neoplasm: Secondary | ICD-10-CM | POA: Diagnosis not present

## 2024-04-01 DIAGNOSIS — K635 Polyp of colon: Secondary | ICD-10-CM | POA: Diagnosis not present

## 2024-04-18 DIAGNOSIS — M9901 Segmental and somatic dysfunction of cervical region: Secondary | ICD-10-CM | POA: Diagnosis not present

## 2024-04-18 DIAGNOSIS — M9902 Segmental and somatic dysfunction of thoracic region: Secondary | ICD-10-CM | POA: Diagnosis not present

## 2024-04-18 DIAGNOSIS — M9903 Segmental and somatic dysfunction of lumbar region: Secondary | ICD-10-CM | POA: Diagnosis not present

## 2024-04-18 DIAGNOSIS — M6240 Contracture of muscle, unspecified site: Secondary | ICD-10-CM | POA: Diagnosis not present

## 2024-05-30 DIAGNOSIS — M6240 Contracture of muscle, unspecified site: Secondary | ICD-10-CM | POA: Diagnosis not present

## 2024-05-30 DIAGNOSIS — M9902 Segmental and somatic dysfunction of thoracic region: Secondary | ICD-10-CM | POA: Diagnosis not present

## 2024-05-30 DIAGNOSIS — M9903 Segmental and somatic dysfunction of lumbar region: Secondary | ICD-10-CM | POA: Diagnosis not present

## 2024-05-30 DIAGNOSIS — M9901 Segmental and somatic dysfunction of cervical region: Secondary | ICD-10-CM | POA: Diagnosis not present

## 2024-06-22 DIAGNOSIS — Z681 Body mass index (BMI) 19 or less, adult: Secondary | ICD-10-CM | POA: Diagnosis not present

## 2024-06-22 DIAGNOSIS — E7849 Other hyperlipidemia: Secondary | ICD-10-CM | POA: Diagnosis not present

## 2024-06-22 DIAGNOSIS — H35033 Hypertensive retinopathy, bilateral: Secondary | ICD-10-CM | POA: Diagnosis not present

## 2024-06-22 DIAGNOSIS — I471 Supraventricular tachycardia, unspecified: Secondary | ICD-10-CM | POA: Diagnosis not present

## 2024-06-22 DIAGNOSIS — Z0000 Encounter for general adult medical examination without abnormal findings: Secondary | ICD-10-CM | POA: Diagnosis not present

## 2024-06-22 DIAGNOSIS — J4520 Mild intermittent asthma, uncomplicated: Secondary | ICD-10-CM | POA: Diagnosis not present

## 2024-07-11 DIAGNOSIS — M9903 Segmental and somatic dysfunction of lumbar region: Secondary | ICD-10-CM | POA: Diagnosis not present

## 2024-07-11 DIAGNOSIS — M9902 Segmental and somatic dysfunction of thoracic region: Secondary | ICD-10-CM | POA: Diagnosis not present

## 2024-07-11 DIAGNOSIS — M6240 Contracture of muscle, unspecified site: Secondary | ICD-10-CM | POA: Diagnosis not present

## 2024-07-11 DIAGNOSIS — M9901 Segmental and somatic dysfunction of cervical region: Secondary | ICD-10-CM | POA: Diagnosis not present

## 2024-08-25 DIAGNOSIS — M9901 Segmental and somatic dysfunction of cervical region: Secondary | ICD-10-CM | POA: Diagnosis not present

## 2024-08-25 DIAGNOSIS — M9903 Segmental and somatic dysfunction of lumbar region: Secondary | ICD-10-CM | POA: Diagnosis not present

## 2024-08-25 DIAGNOSIS — M9902 Segmental and somatic dysfunction of thoracic region: Secondary | ICD-10-CM | POA: Diagnosis not present

## 2024-08-25 DIAGNOSIS — M6240 Contracture of muscle, unspecified site: Secondary | ICD-10-CM | POA: Diagnosis not present

## 2024-10-05 DIAGNOSIS — M6240 Contracture of muscle, unspecified site: Secondary | ICD-10-CM | POA: Diagnosis not present

## 2024-10-05 DIAGNOSIS — M9902 Segmental and somatic dysfunction of thoracic region: Secondary | ICD-10-CM | POA: Diagnosis not present

## 2024-10-05 DIAGNOSIS — M9901 Segmental and somatic dysfunction of cervical region: Secondary | ICD-10-CM | POA: Diagnosis not present

## 2024-10-05 DIAGNOSIS — M9903 Segmental and somatic dysfunction of lumbar region: Secondary | ICD-10-CM | POA: Diagnosis not present

## 2024-11-14 DIAGNOSIS — M9903 Segmental and somatic dysfunction of lumbar region: Secondary | ICD-10-CM | POA: Diagnosis not present

## 2024-11-14 DIAGNOSIS — M9902 Segmental and somatic dysfunction of thoracic region: Secondary | ICD-10-CM | POA: Diagnosis not present

## 2024-11-14 DIAGNOSIS — M9901 Segmental and somatic dysfunction of cervical region: Secondary | ICD-10-CM | POA: Diagnosis not present

## 2024-11-14 DIAGNOSIS — M6240 Contracture of muscle, unspecified site: Secondary | ICD-10-CM | POA: Diagnosis not present
